# Patient Record
Sex: Male | Born: 1980
Health system: Southern US, Community
[De-identification: ages and names within clinical notes are randomized; demographics above are authoritative.]

## PROBLEM LIST (undated history)

## (undated) DIAGNOSIS — K219 Gastro-esophageal reflux disease without esophagitis: Secondary | ICD-10-CM

## (undated) DIAGNOSIS — K649 Unspecified hemorrhoids: Secondary | ICD-10-CM

## (undated) DIAGNOSIS — T7840XA Allergy, unspecified, initial encounter: Secondary | ICD-10-CM

## (undated) DIAGNOSIS — J302 Other seasonal allergic rhinitis: Secondary | ICD-10-CM

## (undated) DIAGNOSIS — Z0000 Encounter for general adult medical examination without abnormal findings: Secondary | ICD-10-CM

## (undated) DIAGNOSIS — M545 Low back pain: Secondary | ICD-10-CM

## (undated) DIAGNOSIS — I861 Scrotal varices: Secondary | ICD-10-CM

## (undated) DIAGNOSIS — R12 Heartburn: Secondary | ICD-10-CM

## (undated) DIAGNOSIS — F419 Anxiety disorder, unspecified: Secondary | ICD-10-CM

## (undated) DIAGNOSIS — R112 Nausea with vomiting, unspecified: Secondary | ICD-10-CM

## (undated) DIAGNOSIS — E785 Hyperlipidemia, unspecified: Secondary | ICD-10-CM

## (undated) DIAGNOSIS — Z8614 Personal history of Methicillin resistant Staphylococcus aureus infection: Secondary | ICD-10-CM

## (undated) DIAGNOSIS — H547 Unspecified visual loss: Secondary | ICD-10-CM

## (undated) DIAGNOSIS — M25512 Pain in left shoulder: Secondary | ICD-10-CM

## (undated) HISTORY — DX: Allergy, unspecified, initial encounter: T78.40XA

## (undated) HISTORY — DX: Heartburn: R12

## (undated) HISTORY — PX: TONSILLECTOMY: SUR1361

## (undated) HISTORY — DX: Hyperlipidemia, unspecified: E78.5

## (undated) HISTORY — DX: Scrotal varices: I86.1

## (undated) HISTORY — DX: Anxiety disorder, unspecified: F41.9

## (undated) HISTORY — DX: Pain in left shoulder: M25.512

## (undated) HISTORY — DX: Personal history of Methicillin resistant Staphylococcus aureus infection: Z86.14

## (undated) HISTORY — DX: Unspecified visual loss: H54.7

## (undated) HISTORY — DX: Other seasonal allergic rhinitis: J30.2

## (undated) HISTORY — DX: Encounter for general adult medical examination without abnormal findings: Z00.00

## (undated) HISTORY — DX: Unspecified hemorrhoids: K64.9

## (undated) HISTORY — DX: Low back pain: M54.5

## (undated) HISTORY — DX: Nausea with vomiting, unspecified: R11.2

---

## 2005-01-10 ENCOUNTER — Ambulatory Visit: Payer: Self-pay | Admitting: Internal Medicine

## 2005-01-18 ENCOUNTER — Ambulatory Visit: Payer: Self-pay | Admitting: Internal Medicine

## 2005-01-19 ENCOUNTER — Ambulatory Visit: Payer: Self-pay | Admitting: *Deleted

## 2005-01-21 ENCOUNTER — Ambulatory Visit (HOSPITAL_COMMUNITY): Admission: RE | Admit: 2005-01-21 | Discharge: 2005-01-21 | Payer: Self-pay | Admitting: Internal Medicine

## 2005-02-01 ENCOUNTER — Ambulatory Visit: Payer: Self-pay | Admitting: Internal Medicine

## 2005-04-04 ENCOUNTER — Ambulatory Visit: Payer: Self-pay | Admitting: Internal Medicine

## 2013-06-06 ENCOUNTER — Encounter (HOSPITAL_COMMUNITY): Payer: Self-pay | Admitting: *Deleted

## 2013-06-06 ENCOUNTER — Emergency Department (INDEPENDENT_AMBULATORY_CARE_PROVIDER_SITE_OTHER)
Admission: EM | Admit: 2013-06-06 | Discharge: 2013-06-06 | Disposition: A | Discharge status: Home / Self Care | Payer: Self-pay | Source: Home / Self Care

## 2013-06-06 DIAGNOSIS — F419 Anxiety disorder, unspecified: Secondary | ICD-10-CM

## 2013-06-06 DIAGNOSIS — IMO0001 Reserved for inherently not codable concepts without codable children: Secondary | ICD-10-CM

## 2013-06-06 DIAGNOSIS — M791 Myalgia, unspecified site: Secondary | ICD-10-CM

## 2013-06-06 DIAGNOSIS — R002 Palpitations: Secondary | ICD-10-CM

## 2013-06-06 DIAGNOSIS — F411 Generalized anxiety disorder: Secondary | ICD-10-CM

## 2013-06-06 HISTORY — DX: Gastro-esophageal reflux disease without esophagitis: K21.9

## 2013-06-06 MED ORDER — GI COCKTAIL ~~LOC~~: 30.0000 mL | Freq: Once | ORAL | Status: DC

## 2013-06-06 MED ORDER — HYDROXYZINE HCL 25 MG PO TABS: 25.0000 mg | ORAL_TABLET | Freq: Every evening | ORAL | Status: DC | PRN

## 2013-06-06 MED ORDER — GI COCKTAIL ~~LOC~~
ORAL | Status: AC
  Filled 2013-06-06: qty 30

## 2013-06-06 NOTE — ED Provider Notes (Signed)
Scott Molina is a 32 y.o. male who presents to Urgent Care today for multiple complaints.  1) increasing anxiety. Patient is feeling more anxious recently. He notes he has a history of bipolar disorder as a child but is currently not in any medication and has been doing well.  2) upper extremity fatigue: Patient notes soreness and fatigue in his upper extremities. He notes that he has been increasing his exercise and working out more recently. He recently started doing pushups and wonders if his arm symptoms are related. He denies any radiating pain weakness or numbness or lack of coordination. Denies any neck pain.  3) burping and increased gas: Patient notes increased burping and a decrease in appetite recently. He has a history of reflux and has been taking Pepcid occasionally. He denies any abdominal pain nausea vomiting or diarrhea.  4) palpitations: Patient notes increasing palpitations recently. He denies any chest pain or trouble breathing or wheezing. He does drink quite a bit of coffee.     PMH reviewed. Bipolar disorder as a child previously well treated with Depakote sertraline and ADHD medication History  Substance Use Topics  . Smoking status: Current Every Day Smoker  . Smokeless tobacco: Not on file  . Alcohol Use: Yes   ROS as above Medications reviewed. No current facility-administered medications for this encounter.   Current Outpatient Prescriptions  Medication Sig Dispense Refill  . Famotidine (PEPCID PO) Take by mouth.      . hydrOXYzine (ATARAX/VISTARIL) 25 MG tablet Take 1 tablet (25 mg total) by mouth at bedtime as needed for anxiety.  30 tablet  0    Exam:  BP 123/91  Pulse 105  Temp(Src) 98.3 F (36.8 C) (Oral)  Resp 28  SpO2 100% Gen: Well NAD HEENT: EOMI,  MMM Lungs: CTABL Nl WOB Heart: RRR no MRG Abd: NABS, NT, ND Exts: Non edematous BL  LE, warm and well perfused.  Neck: Nontender spinal midline normal neck range of motion negative Spurling test  bilaterally Cervical nerve root strength testing and sensation testing is intact bilateral upper extremity Psych: Anxious-appearing speech and thought process is slightly rapid and tangential. Patient perseverates about his arm complaints.  No SI/HI no delusions or hallucinations expressed. Moderate insight.    No results found for this or any previous visit (from the past 24 hour(s)). No results found.  12 lead EKG shows normal sinus rhythm at 89 beats per minute. Otherwise normal  Assessment and Plan: 32 y.o. male with essentially an anxiety disorder. I suspect the patient is having a hypomanic episode with his previously well controlled without medication bipolar disorder. He has multiple somatic complaints with no physical exam or laboratory abnormalities.  His EKG is normal appearing.  We had a lengthy discussion about the nature of his anxiety and the ideal treatment.  Plan to refer to the [facility] for further evaluation and management of his anxiety disorder.  We'll treat night  anxiety with hydroxyzine. Followup if not improving or worsening, Discussed warning signs or symptoms. Please see discharge instructions. Patient expresses understanding.       Rodolph Bong, MD 06/06/13 [phone]

## 2013-06-06 NOTE — ED Notes (Signed)
Pt  Has  Numerous  Complaints  With  The  Symptoms       Starting  About  4  Days       Ago     -   He  Reports  Symptoms  Of  Indigestion  With        Rumbling  Sensation     As   Well  As  decreased  Appetite     He  Is  Sitting  Upright on the  Exam table  He  Is  Speaking in  Complete  sentances  And      Seems  Somewhat  Anxious     He  denys  Any  Vomiting       He  Does    Report  Some vague  Symptoms  Of malaise

## 2014-03-21 ENCOUNTER — Emergency Department (INDEPENDENT_AMBULATORY_CARE_PROVIDER_SITE_OTHER)
Admission: EM | Admit: 2014-03-21 | Discharge: 2014-03-21 | Disposition: A | Discharge status: Home / Self Care | Payer: BC Managed Care – PPO | Source: Home / Self Care | Attending: Emergency Medicine | Admitting: Emergency Medicine

## 2014-03-21 ENCOUNTER — Encounter (HOSPITAL_COMMUNITY): Payer: Self-pay | Admitting: Emergency Medicine

## 2014-03-21 ENCOUNTER — Emergency Department (INDEPENDENT_AMBULATORY_CARE_PROVIDER_SITE_OTHER): Payer: BC Managed Care – PPO

## 2014-03-21 DIAGNOSIS — K219 Gastro-esophageal reflux disease without esophagitis: Secondary | ICD-10-CM

## 2014-03-21 DIAGNOSIS — F411 Generalized anxiety disorder: Secondary | ICD-10-CM

## 2014-03-21 DIAGNOSIS — F419 Anxiety disorder, unspecified: Secondary | ICD-10-CM

## 2014-03-21 MED ORDER — LORAZEPAM 0.5 MG PO TABS: 0.5000 mg | ORAL_TABLET | Freq: Three times a day (TID) | ORAL | Status: DC | PRN

## 2014-03-21 MED ORDER — OMEPRAZOLE 20 MG PO CPDR: 20.0000 mg | DELAYED_RELEASE_CAPSULE | Freq: Two times a day (BID) | ORAL | Status: DC

## 2014-03-21 NOTE — ED Notes (Signed)
Patient complains of shortness of breath with some tingling sensation of left hand; states gas and pressure in abdomen; Also states he may have been having panic attacks.  Patient triaged and brought back for EKG per protocols.

## 2014-03-21 NOTE — ED Provider Notes (Signed)
Chief Complaint   Chief Complaint  Patient presents with  . Shortness of Breath    History of Present Illness    Isla PenceMatthew S Garris is a 33 year old male who has had a three-week history of intermittent difficulty breathing. This is relieved by taking Pepcid or by resting indoors in an air conditioned environment. The patient feels tired and exhausted. Sometimes he feels like he is about to pass out. He's had a dry cough. He's also had tightness across his upper chest that comes and goes. He denies any exertional chest pain or discomfort. He's had no nausea or diaphoresis. He also complains of a toothache in his left, upper second molar, pain in his back, heartburn, palpitations, and some left hip pain. He's had no fever or chills. No URI symptoms. He denies any neck pain or pain in his upper back. He denies any syncope. There's been no abdominal pain, although his abdomen has felt bloated and tight. No nausea or vomiting. No constipation, diarrhea, or blood in the stool. He denies any extremity pain, paresthesias, or swelling. He's had no prolonged car or plane trips. No cardiac history, no history of DVT or pulmonary embolism. He was seen last summer for the same symptoms. Workup in the urgent care Center was negative. He was diagnosed as having indigestion and heartburn and anxiety. He has not gotten any of medications filled, but is just taking over-the-counter meds. He does not have a primary care doctor.  Review of Systems    Other than noted above, the patient denies any of the following symptoms. Systemic:  No fever or chills. Pulmonary:  No cough, wheezing, shortness of breath, sputum production, hemoptysis. Cardiac:  No palpitations, rapid heartbeat, dizziness, presyncope or syncope. GI:  No abdominal pain, heartburn, nausea, or vomiting. Ext:  No leg pain or swelling.  PMFSH    Past medical history, family history, social history, meds, and allergies were reviewed.   Physical Exam      Vital signs:  BP 131/75  Pulse 90  Temp(Src) 97.9 F (36.6 C) (Oral)  Resp 20  SpO2 100% Gen:  Alert, oriented, in no distress, skin warm and dry. Eye:  PERRL, lids and conjunctivas normal.  Sclera non-icteric. ENT:  Mucous membranes moist, pharynx clear. Neck:  Supple, no adenopathy or tenderness.  No JVD. Lungs:  Clear to auscultation, no wheezes, rales or rhonchi.  No respiratory distress. Heart:  Regular rhythm.  No gallops, murmers, clicks or rubs. Chest:  No chest wall tenderness. Abdomen:  Soft, nontender, no organomegaly or mass.  Bowel sounds normal.  No pulsatile abdominal mass or bruit. Ext:  No edema.  No calf tenderness and Homann's sign negative.  Pulses full and equal. Skin:  Warm and dry.  No rash.  Radiology     Dg Chest 2 View  03/21/2014   CLINICAL DATA:  Labored breathing, shortness of breath.  EXAM: CHEST  2 VIEW  COMPARISON:  None.  FINDINGS: The heart size and mediastinal contours are within normal limits. Both lungs are clear. The visualized skeletal structures are unremarkable.  IMPRESSION: No active cardiopulmonary disease.   Electronically Signed   By: Charlett NoseKevin  Dover M.D.   On: 03/21/2014 17:18   I reviewed the images independently and personally and concur with the radiologist's findings.  EKG Results:  Date: 03/21/2014  Rate: 100  Rhythm: normal sinus rhythm  QRS Axis: normal  Intervals: normal  ST/T Wave abnormalities: normal  Conduction Disutrbances:none  Narrative Interpretation: Normal sinus rhythm, normal  EKG.  Old EKG Reviewed: none available                                                                                                                                           Assessment     The primary encounter diagnosis was GERD (gastroesophageal reflux disease). A diagnosis of Anxiety was also pertinent to this visit.  No evidence of acute coronary syndrome, pulmonary embolism, pneumothorax, pericarditis, esophageal rupture, or  ruptured aneurysm.  Plan     1.  Meds:  The following meds were prescribed:   Discharge Medication List as of 03/21/2014  5:58 PM    START taking these medications   Details  LORazepam (ATIVAN) 0.5 MG tablet Take 1 tablet (0.5 mg total) by mouth every 8 (eight) hours as needed for anxiety., Starting 03/21/2014, Until Discontinued, Print    omeprazole (PRILOSEC) 20 MG capsule Take 1 capsule (20 mg total) by mouth 2 (two) times daily before a meal., Starting 03/21/2014, Until Discontinued, Normal        2.  Patient Education/Counseling:  The patient was given appropriate handouts, self care instructions, and instructed in symptomatic relief.    3.  Follow up:  The patient was told to follow up here if no better in 3 to 4 days, or sooner if becoming worse in any way, and give an an some red flag symptoms such as worsening pain, shortness of breath, dizziness, or passing out which would prompt immediate return. Suggest he followup with her primary care physician as soon as possible.     Reuben Likesavid C Bueford Arp, MD 03/21/14 [id number]

## 2014-03-21 NOTE — Discharge Instructions (Signed)

## 2014-11-25 ENCOUNTER — Ambulatory Visit (INDEPENDENT_AMBULATORY_CARE_PROVIDER_SITE_OTHER): Payer: 59 | Admitting: Medical

## 2014-11-25 ENCOUNTER — Encounter: Payer: Self-pay | Admitting: Medical

## 2014-11-25 VITALS — BP 112/80 | HR 78 | Temp 98.0°F | Resp 16 | Wt 180.0 lb

## 2014-11-25 DIAGNOSIS — K648 Other hemorrhoids: Secondary | ICD-10-CM

## 2014-11-25 DIAGNOSIS — K644 Residual hemorrhoidal skin tags: Secondary | ICD-10-CM

## 2014-11-25 DIAGNOSIS — K036 Deposits [accretions] on teeth: Secondary | ICD-10-CM

## 2014-11-25 DIAGNOSIS — S025XXA Fracture of tooth (traumatic), initial encounter for closed fracture: Secondary | ICD-10-CM

## 2014-11-25 DIAGNOSIS — R109 Unspecified abdominal pain: Secondary | ICD-10-CM

## 2014-11-25 DIAGNOSIS — L309 Dermatitis, unspecified: Secondary | ICD-10-CM

## 2014-11-25 DIAGNOSIS — K219 Gastro-esophageal reflux disease without esophagitis: Secondary | ICD-10-CM

## 2014-11-25 MED ORDER — HYDROCORTISONE 2.5 % RE CREA: 1.0000 "application " | TOPICAL_CREAM | Freq: Two times a day (BID) | RECTAL | Status: DC

## 2014-11-25 NOTE — Progress Notes (Signed)
Subjective: Here as a new patient today.  Has several concerns.  Usually in good health, has several questions, no recent health care visits.  Here for concern of hemorrhoids.  As a kid had hemorrhoid from time to time that would resolve within a day or 2.  He notes current hemorrhoid x 1 week currently, itching, not sure about hanging lesion except when using the bathroom.    Has seen small bit of blood on toilet paper here in the past week.  Was bright red.  Takes a bath daily.  Uses diaper wipes with allow.  There is some discomfort with wiping.  Has BM daily in general, but with oatmeal, goes 2-3 times daily.  Usually stools are solid, not too form.  This is the first time he has had blood.   GERD - takes Pepcid occasionally, including last week given that he has been eating some pizza.     Doesn't take NSAID regularly, but does so occasionally for intermittent back and leg pains.   Is a cigar smoker.  Rarely drinks alcohol, at most 6-12 glasses of wine yearly, occasional rare scotch, few beers in a year's time.    Has intermittent rash that flares up on both upper lateral arms.     ROS as in subjective   Objective: BP 112/80 mmHg  Pulse 78  Temp(Src) 98 F (36.7 C) (Oral)  Resp 16  Wt 180 lb (81.647 kg)  General appearance: alert, no distress, WD/WN Oral cavity: MMM, moderate plaque throughout, 1 left upper posterior molar with cracked tooth, no other lesions Neck: supple, no lymphadenopathy, no thyromegaly, no masses Heart: RRR, normal S1, S2, no murmurs Lungs: CTA bilaterally, no wheezes, rhonchi, or rales Abdomen: +bs, soft, non tender, non distended, no masses, no hepatomegaly, no splenomegaly Pulses: 2+ symmetric, upper and lower extremities, normal cap refill DRE: anterior slightly tender hemorrhoid, small, non thrombosed, no fissure, otherwise normal appearing, declines DRE Skin: upper lateral arms bilat with small rough skin patches, nonspecific, suggestive of  eczema  Assessment: Encounter Diagnoses  Name Primary?  . External hemorrhoid Yes  . Gastroesophageal reflux disease without esophagitis   . Abdominal discomfort   . Eczema   . Dental plaque   . Broken tooth, closed, initial encounter      Plan: External hemorrhoid - discussed diagnosis, treatment, prevention, fiber and water intake, avoid heavy lifting, prolonged straining.   Can use triamcinolone topical prn for up to a 5-7 days at a time, SITZ baths, can use wet wipes during flare ups.  Discussed possible complications.   GERD - avoid reflux triggers, c/t Pepcid prn  Abdominal discomfort - likely due to diet choices.  Avoid triggers.  Eczema - can use triamcinolone cream topical as long as cream not contaminated  Dental plaque, broken tooth - f/u with dentist soon

## 2015-04-23 ENCOUNTER — Ambulatory Visit (HOSPITAL_BASED_OUTPATIENT_CLINIC_OR_DEPARTMENT_OTHER)
Admission: RE | Admit: 2015-04-23 | Discharge: 2015-04-23 | Disposition: A | Discharge status: Home / Self Care | Payer: 59 | Source: Ambulatory Visit | Attending: Internal Medicine | Admitting: Internal Medicine

## 2015-04-23 ENCOUNTER — Encounter: Payer: Self-pay | Admitting: Internal Medicine

## 2015-04-23 ENCOUNTER — Other Ambulatory Visit: Payer: Self-pay | Admitting: Internal Medicine

## 2015-04-23 ENCOUNTER — Telehealth: Payer: Self-pay

## 2015-04-23 ENCOUNTER — Ambulatory Visit (INDEPENDENT_AMBULATORY_CARE_PROVIDER_SITE_OTHER): Payer: Self-pay | Admitting: Internal Medicine

## 2015-04-23 VITALS — BP 148/92 | HR 87 | Temp 98.0°F | Ht 71.25 in | Wt 185.4 lb

## 2015-04-23 DIAGNOSIS — N503 Cyst of epididymis: Secondary | ICD-10-CM | POA: Insufficient documentation

## 2015-04-23 DIAGNOSIS — I861 Scrotal varices: Secondary | ICD-10-CM | POA: Diagnosis not present

## 2015-04-23 DIAGNOSIS — R1909 Other intra-abdominal and pelvic swelling, mass and lump: Secondary | ICD-10-CM | POA: Diagnosis present

## 2015-04-23 DIAGNOSIS — F4323 Adjustment disorder with mixed anxiety and depressed mood: Secondary | ICD-10-CM

## 2015-04-23 DIAGNOSIS — N5082 Scrotal pain: Secondary | ICD-10-CM

## 2015-04-23 DIAGNOSIS — N508 Other specified disorders of male genital organs: Secondary | ICD-10-CM

## 2015-04-23 MED ORDER — LORAZEPAM 0.5 MG PO TABS: 0.5000 mg | ORAL_TABLET | Freq: Three times a day (TID) | ORAL | Status: DC | PRN

## 2015-04-23 NOTE — Patient Instructions (Addendum)
We will schedule a ultrasound  Please keep the appointment to see your no primary doctor Dr. Abner GreenspanBlyth  Call anytime if the area start hurting or gets worse     Varicocele A varicocele is a swelling of veins in the scrotum (the bag of skin that contains the testicles). It is most common in young men. It occurs most often on the left side. Small or painless varicoceles do not need treatment. Most often, this is not a serious problem, but further tests may be needed to confirm the diagnosis. Surgery may be needed if complications of varicoceles arise. Rarely, varicoceles can reoccur after surgery. CAUSES  The swelling is due to blood backing up in the vein that leads from the testicle back to the body. Blood backs up because the valves inside the vein are not working properly. Veins normally return blood to the heart. Valves in veins are supposed to be one-way valves. They should not allow blood to flow backwards. If the valves do not work well, blood can pool in a vein and make it swell. The same thing happens with varicose veins in the leg. SYMPTOMS  A varicocele most often causes no symptoms. When they occur, symptoms include:   Swelling on one side of the scrotum.  Swelling that is more obvious when standing up.  A lumpy feeling in the scrotum.  Heaviness on one side of the scrotum.  Dull ache in the scrotum, especially after exercise or prolonged standing or sitting.  Slower growth or reduced size of the testicle on the side of the varicocele (in young males).  Problems with fertility can arise if the testicle does not grow normally. DIAGNOSIS  Varicocele is usually diagnosed by a physical exam. Sometimes ultrasonography is done. TREATMENT  Usually, varicoceles need no treatment. They are often routinely monitored on exam by your caregiver to ensure they do not slow the growth of the testicle on that side. Treatment may be needed if:  The varicocele is large.  There is a lot of  pain.  The varicocele causes a decrease in the size of the testicle in a growing adolescent.  The other testicle is absent or not normal.  Varicoceles are found on both sides of the scrotum.  There is pain when exercising.  There are fertility problems. There are two types of treatment:  Surgery. The surgeon ties off the swollen veins. Surgery may be done with an incision in the skin or through a laparoscope. The surgery is usually done in an outpatient setting. Outpatient means there is no overnight stay in a hospital.  Embolization. A small tube is placed in a vein and guided into the swollen veins. X-rays are used to guide the small tube. Tiny metal coils or other blocking items are put through the tube. This blocks swollen veins and the flow of blood. This is usually done in an outpatient setting without the use of general anesthesia. HOME CARE INSTRUCTIONS  To decrease discomfort:  Wear supportive underwear.  Use an athletic supporter for sports.  Only take over-the-counter or prescription medicines for pain or discomfort as directed by your caregiver. SEEK MEDICAL CARE IF:   Pain is increasing.  Swelling does not decrease when lying down.  Testicle is smaller.  The testicle becomes enlarged, swollen, red, or painful. Document Released: 01/02/2001 Document Revised: 12/19/2011 Document Reviewed: 01/06/2010 [facility] Patient Information 2015 [employer] [place]. This information is not intended to replace advice given to you by your health care provider. Make sure you  discuss any questions you have with your health care provider.  

## 2015-04-23 NOTE — Telephone Encounter (Signed)
-----   Message from Oneal GroutJennifer S Sebastian sent at 04/23/2015  1:10 PM EDT ----- Pt is coming today for US, order is not in. Can you place order?

## 2015-04-23 NOTE — Progress Notes (Signed)
Pre visit review using our clinic review tool, if applicable. No additional management support is needed unless otherwise documented below in the visit note. 

## 2015-04-23 NOTE — Progress Notes (Signed)
Subjective:    Patient ID: Scott Molina, male    DOB: September 21, 1981, 34 y.o.   MRN: [id number]  DOS:  04/23/2015 Type of visit - description : To get established with Dr. Abner Greenspan as PCP in few months but here for an acute visit Interval history: His concern today about a lump on the left scrotum, has been there since 2006, at that time a ultrasound was done, was told it was benign. The area feels puffy, denies pain, congestion feeling? Symptoms are on a no but more noticeable lately. Also he takes Ativan as needed and request a refill   Review of Systems  Denies fever chills No scrotal injury No dysuria, gross hematuria difficulty urinating.  Past Medical History  Diagnosis Date  . GERD (gastroesophageal reflux disease)   . Allergy   . Anxiety     At [employer]  . History of MRSA infection     Past Surgical History  Procedure Laterality Date  . Tonsillectomy      History   Social History  . Marital Status: Single    Spouse Name: N/A  . Number of Children: 0  . Years of Education: N/A   Occupational History  . 2 jobs    Social History Main Topics  . Smoking status: Current Some Day Smoker  . Smokeless tobacco: Not on file     Comment: CIGARS  . Alcohol Use: 0.0 oz/week    0 Standard drinks or equivalent per week  . Drug Use: Not on file  . Sexual Activity: Not on file   Other Topics Concern  . Not on file   Social History Narrative   Live w/ family     Family History  Problem Relation Age of Onset  . Colon cancer Neg Hx   . Prostate cancer Neg Hx   . Testicular cancer        Medication List       This list is accurate as of: 04/23/15  2:17 PM.  Always use your most recent med list.               LORazepam 0.5 MG tablet  Commonly known as:  ATIVAN  Take 1 tablet (0.5 mg total) by mouth every 8 (eight) hours as needed for anxiety.     PEPCID PO  Take 1 tablet by mouth daily.           Objective:   Physical Exam BP 148/92 mmHg   Pulse 87  Temp(Src) 98 F (36.7 C) (Oral)  Ht 5' 11.25" (1.81 m)  Wt 185 lb 6 oz (84.086 kg)  BMI 25.67 kg/m2  SpO2 97% General:   Well developed, well nourished . NAD.  HEENT:  Normocephalic . Face symmetric, atraumatic Lungs:  CTA B Normal respiratory effort, no intercostal retractions, no accessory muscle use. Heart: RRR,  no murmur.  no pretibial edema bilaterally  Abdomen:  Not distended, soft, non-tender. No rebound or rigidity. No mass,organomegaly GU: Scrotal contents: Testicles seem normal, there is a soft, nodular mass around the left testicle. Slightly sensitive but not really tender. No hernias Penis normal Skin: Not pale. Not jaundice Neurologic:  alert & oriented X3.  Speech normal, gait appropriate for age and unassisted Psych--  Cognition and judgment appear intact.  Cooperative with normal attention span and concentration.  Behavior appropriate. No anxious or depressed appearing.       Assessment & Plan:    Scrotal mass, discomfort, suspect varicocele Explained the patient  the benign nature of this condition however in order to be certain needs a ultrasound. Also explained that it can affect his fertility at some point and if the symptoms increase surgery is an option. He seemed to understand and will call me if he likes a urology referral. Multiple questions answered to the best of my ability, information provided  Anxiety, Request a refill, provide a small amount of Ativan until he sees his new PCP

## 2015-10-29 ENCOUNTER — Ambulatory Visit (INDEPENDENT_AMBULATORY_CARE_PROVIDER_SITE_OTHER): Payer: BLUE CROSS/BLUE SHIELD | Admitting: Family Medicine

## 2015-10-29 ENCOUNTER — Encounter: Payer: Self-pay | Admitting: Family Medicine

## 2015-10-29 VITALS — BP 102/76 | HR 93 | Temp 98.2°F | Ht 71.0 in | Wt 193.0 lb

## 2015-10-29 DIAGNOSIS — M545 Low back pain: Secondary | ICD-10-CM

## 2015-10-29 DIAGNOSIS — H547 Unspecified visual loss: Secondary | ICD-10-CM

## 2015-10-29 DIAGNOSIS — K649 Unspecified hemorrhoids: Secondary | ICD-10-CM | POA: Insufficient documentation

## 2015-10-29 DIAGNOSIS — R1013 Epigastric pain: Secondary | ICD-10-CM

## 2015-10-29 DIAGNOSIS — R112 Nausea with vomiting, unspecified: Secondary | ICD-10-CM

## 2015-10-29 DIAGNOSIS — R12 Heartburn: Secondary | ICD-10-CM | POA: Diagnosis not present

## 2015-10-29 DIAGNOSIS — I861 Scrotal varices: Secondary | ICD-10-CM

## 2015-10-29 DIAGNOSIS — K219 Gastro-esophageal reflux disease without esophagitis: Secondary | ICD-10-CM | POA: Insufficient documentation

## 2015-10-29 DIAGNOSIS — Z0000 Encounter for general adult medical examination without abnormal findings: Secondary | ICD-10-CM | POA: Diagnosis not present

## 2015-10-29 DIAGNOSIS — J302 Other seasonal allergic rhinitis: Secondary | ICD-10-CM

## 2015-10-29 HISTORY — DX: Unspecified hemorrhoids: K64.9

## 2015-10-29 HISTORY — DX: Unspecified visual loss: H54.7

## 2015-10-29 HISTORY — DX: Encounter for general adult medical examination without abnormal findings: Z00.00

## 2015-10-29 HISTORY — DX: Heartburn: R12

## 2015-10-29 HISTORY — DX: Other seasonal allergic rhinitis: J30.2

## 2015-10-29 HISTORY — DX: Nausea with vomiting, unspecified: R11.2

## 2015-10-29 NOTE — Patient Instructions (Addendum)
Salon pas or Aspercreme patches. Daily or twice daily Moist heat and stretching [employer] does a good hearing exam and an eye exam. Probiotic daily such as Digestive Advantage or Aneta Mins Colon or online at [employer] has NOW company 10 strain cap take one daily Back Pain, Adult Back pain is very common in adults.The cause of back pain is rarely dangerous and the pain often gets better over time.The cause of your back pain may not be known. Some common causes of back pain include:  Strain of the muscles or ligaments supporting the spine.  Wear and tear (degeneration) of the spinal disks.  Arthritis.  Direct injury to the back. For many people, back pain may return. Since back pain is rarely dangerous, most people can learn to manage this condition on their own. HOME CARE INSTRUCTIONS Watch your back pain for any changes. The following actions may help to lessen any discomfort you are feeling:  Remain active. It is stressful on your back to sit or stand in one place for long periods of time. Do not sit, drive, or stand in one place for more than 30 minutes at a time. Take short walks on even surfaces as soon as you are able.Try to increase the length of time you walk each day.  Exercise regularly as directed by your health care provider. Exercise helps your back heal faster. It also helps avoid future injury by keeping your muscles strong and flexible.  Do not stay in bed.Resting more than 1-2 days can delay your recovery.  Pay attention to your body when you bend and lift. The most comfortable positions are those that put less stress on your recovering back. Always use proper lifting techniques, including:  Bending your knees.  Keeping the load close to your body.  Avoiding twisting.  Find a comfortable position to sleep. Use a firm mattress and lie on your side with your knees slightly bent. If you lie on your back, put a pillow under your knees.  Avoid feeling anxious or  stressed.Stress increases muscle tension and can worsen back pain.It is important to recognize when you are anxious or stressed and learn ways to manage it, such as with exercise.  Take medicines only as directed by your health care provider. Over-the-counter medicines to reduce pain and inflammation are often the most helpful.Your health care provider may prescribe muscle relaxant drugs.These medicines help dull your pain so you can more quickly return to your normal activities and healthy exercise.  Apply ice to the injured area:  Put ice in a plastic bag.  Place a towel between your skin and the bag.  Leave the ice on for 20 minutes, 2-3 times a day for the first 2-3 days. After that, ice and heat may be alternated to reduce pain and spasms.  Maintain a healthy weight. Excess weight puts extra stress on your back and makes it difficult to maintain good posture. SEEK MEDICAL CARE IF:  You have pain that is not relieved with rest or medicine.  You have increasing pain going down into the legs or buttocks.  You have pain that does not improve in one week.  You have night pain.  You lose weight.  You have a fever or chills. SEEK IMMEDIATE MEDICAL CARE IF:   You develop new bowel or bladder control problems.  You have unusual weakness or numbness in your arms or legs.  You develop nausea or vomiting.  You develop abdominal pain.  You feel faint.   This  information is not intended to replace advice given to you by your health care provider. Make sure you discuss any questions you have with your health care provider.   Document Released: 09/26/2005 Document Revised: 10/17/2014 Document Reviewed: 01/28/2014 Elsevier Interactive Patient Education [employer].

## 2015-10-29 NOTE — Progress Notes (Signed)
Subjective:    Patient ID: Scott Molina, male    DOB: [birth year], 35 y.o.   MRN: [id number]  Chief Complaint  Patient presents with  . Annual Exam    new patient    HPI Patient is in today for annual exam, follow-up on numerous medical concerns and official new patient appointment. He does denies any acute illness or fever but does have some ongoing trouble with low back pain which is worsened recently. He has some radiculopathy into both extremities left more than right at times. Notes some crepitus in his knees. Denies any recent falls or trauma. Has some occasional trouble with hemorrhoids scant blood on the tissue after straining. No excessive bleeding or severe pain. Denies CP/palp/SOB/HA/congestion/fevers. Taking meds as prescribed  Past Medical History  Diagnosis Date  . GERD (gastroesophageal reflux disease)   . Allergy   . Anxiety     At [employer]  . History of MRSA infection   . Preventative health care 10/29/2015  . Decreased visual acuity 10/29/2015  . Nausea with vomiting 10/29/2015  . Heartburn 10/29/2015  . Hemorrhoid 10/29/2015  . Seasonal allergies 10/29/2015  . Low back pain 11/08/2015  . Left varicocele 11/08/2015    Past Surgical History  Procedure Laterality Date  . Tonsillectomy      Family History  Problem Relation Age of Onset  . Colon cancer Neg Hx   . Prostate cancer Neg Hx   . Testicular cancer    . Diabetes Mother   . Diabetes Father   . Hepatitis C Father   . Arthritis Father     back pain  . Arthritis Sister     knees and back  . Arthritis Brother   . Arthritis Maternal Uncle     wear and tear  . Cancer Paternal Grandmother     ? form    Social History   Social History  . Marital Status: Single    Spouse Name: N/A  . Number of Children: 0  . Years of Education: N/A   Occupational History  . 2 jobs    Social History Main Topics  . Smoking status: Smoker, Current Status Unknown    Types: Cigars, Pipe  . Smokeless  tobacco: Not on file     Comment: CIGARS  . Alcohol Use: 0.0 oz/week    0 Standard drinks or equivalent per week  . Drug Use: Not on file  . 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 Not on file   Social History Narrative   Live w/ family    Outpatient Prescriptions Prior to Visit  Medication Sig Dispense Refill  . Famotidine (PEPCID PO) Take 1 tablet by mouth daily.     Marland Kitchen LORazepam (ATIVAN) 0.5 MG tablet Take 1 tablet (0.5 mg total) by mouth every 8 (eight) hours as needed for anxiety. (Patient not taking: Reported on 10/29/2015) 30 tablet 0   No facility-administered medications prior to visit.    Allergies  Allergen Reactions  . Aspartame And Phenylalanine   . Aspergum [Aspirin]     This is in gum    Review of Systems  Constitutional: Negative for fever, chills and malaise/fatigue.  HENT: Negative for congestion and hearing loss.   Eyes: Negative for discharge.  Respiratory: Negative for cough, sputum production and shortness of breath.   Cardiovascular: Negative for chest pain,  palpitations and leg swelling.  Gastrointestinal: Positive for heartburn, abdominal pain and blood in stool. Negative for nausea, vomiting, diarrhea and constipation.  Genitourinary: Negative for dysuria, urgency, frequency and hematuria.  Musculoskeletal: Positive for back pain. Negative for myalgias and falls.  Skin: Negative for rash.  Neurological: Negative for dizziness, sensory change, loss of consciousness, weakness and headaches.  Endo/Heme/Allergies: Negative for environmental allergies. Does not bruise/bleed easily.  Psychiatric/Behavioral: Negative for depression and suicidal ideas. The patient is not nervous/anxious and does not have insomnia.        Objective:    Physical Exam  Constitutional: He is oriented to person, place, and time. He appears well-developed  and well-nourished. No distress.  HENT:  Head: Normocephalic and atraumatic.  Eyes: Conjunctivae are normal.  Neck: Neck supple. No thyromegaly present.  Cardiovascular: Normal rate, regular rhythm and normal heart sounds.   No murmur heard. Pulmonary/Chest: Effort normal and breath sounds normal. No respiratory distress. He has no wheezes.  Abdominal: Soft. Bowel sounds are normal. He exhibits no mass. There is no tenderness.  Musculoskeletal: He exhibits no edema.  Lymphadenopathy:    He has no cervical adenopathy.  Neurological: He is alert and oriented to person, place, and time.  Skin: Skin is warm and dry.  Psychiatric: He has a normal mood and affect. His behavior is normal.    BP 102/76 mmHg  Pulse 93  Temp(Src) 98.2 F (36.8 C) (Oral)  Ht  (1.803 m)  Wt 193 lb (87.544 kg)  BMI 26.93 kg/m2  SpO2 98% Wt Readings from Last 3 Encounters:  10/29/15 193 lb (87.544 kg)  04/23/15 185 lb 6 oz (84.086 kg)  11/25/14 180 lb (81.647 kg)     No results found for: WBC, HGB, HCT, PLT, GLUCOSE, CHOL, TRIG, HDL, LDLDIRECT, LDLCALC, ALT, AST, NA, K, CL, CREATININE, BUN, CO2, TSH, PSA, INR, GLUF, HGBA1C, MICROALBUR      Assessment & Plan:   Problem List Items Addressed This Visit    Decreased visual acuity   Epigastric pain    H pylori negative, start probiotics report if worsens      Relevant Orders   US Abdomen Complete   Heartburn    Avoid offending foods, start probiotics. Do not eat large meals in late evening and consider raising head of bed.       Relevant Orders   US Abdomen Complete   Hemorrhoid    Increase fluid, fiber and add probiotics. Consider referral to GI if symptoms do not improve      Left varicocele   Low back pain    Encouraged moist heat and gentle stretching as tolerated. May try NSAIDs and prescription meds as directed and report if symptoms worsen or seek immediate care      Nausea with vomiting    Resolved today. Encouraged to avoid  offending foots and try ginger prn      Preventative health care - Primary    .Patient encouraged to maintain heart healthy diet, regular exercise, adequate sleep. Consider daily probiotics. Take medications as prescribed      Seasonal allergies    Consider nasal saline and Flonase and breath [facility]         I am having Mr. Romagnoli maintain his Famotidine (PEPCID PO) and LORazepam.  No orders of the defined types were placed in this encounter.     Reuel Derby, MD

## 2015-10-29 NOTE — Assessment & Plan Note (Signed)
Consider nasal saline and Flonase and breath [facility]

## 2015-10-29 NOTE — Progress Notes (Signed)
Pre visit review using our clinic review tool, if applicable. No additional management support is needed unless otherwise documented below in the visit note. 

## 2015-11-08 ENCOUNTER — Encounter: Payer: Self-pay | Admitting: Family Medicine

## 2015-11-08 DIAGNOSIS — M545 Low back pain, unspecified: Secondary | ICD-10-CM

## 2015-11-08 DIAGNOSIS — R1013 Epigastric pain: Secondary | ICD-10-CM | POA: Insufficient documentation

## 2015-11-08 DIAGNOSIS — I861 Scrotal varices: Secondary | ICD-10-CM | POA: Insufficient documentation

## 2015-11-08 HISTORY — DX: Scrotal varices: I86.1

## 2015-11-08 HISTORY — DX: Low back pain, unspecified: M54.50

## 2015-11-08 NOTE — Assessment & Plan Note (Signed)
Avoid offending foods, start probiotics. Do not eat large meals in late evening and consider raising head of bed.  

## 2015-11-08 NOTE — Assessment & Plan Note (Signed)
Resolved today. Encouraged to avoid offending foots and try ginger prn

## 2015-11-08 NOTE — Assessment & Plan Note (Signed)
H pylori negative, start probiotics report if worsens

## 2015-11-08 NOTE — Assessment & Plan Note (Signed)
Increase fluid, fiber and add probiotics. Consider referral to GI if symptoms do not improve

## 2015-11-08 NOTE — Assessment & Plan Note (Signed)
Encouraged moist heat and gentle stretching as tolerated. May try NSAIDs and prescription meds as directed and report if symptoms worsen or seek immediate care 

## 2015-11-08 NOTE — Assessment & Plan Note (Signed)
Patient encouraged to maintain heart healthy diet, regular exercise, adequate sleep. Consider daily probiotics. Take medications as prescribed 

## 2016-01-11 ENCOUNTER — Ambulatory Visit (INDEPENDENT_AMBULATORY_CARE_PROVIDER_SITE_OTHER): Payer: BLUE CROSS/BLUE SHIELD | Admitting: Family

## 2016-01-11 ENCOUNTER — Telehealth: Payer: Self-pay | Admitting: Family Medicine

## 2016-01-11 ENCOUNTER — Encounter: Payer: Self-pay | Admitting: Family

## 2016-01-11 VITALS — BP 122/86 | HR 83 | Temp 98.3°F | Resp 16 | Ht 71.0 in | Wt 190.2 lb

## 2016-01-11 DIAGNOSIS — R002 Palpitations: Secondary | ICD-10-CM

## 2016-01-11 DIAGNOSIS — F411 Generalized anxiety disorder: Secondary | ICD-10-CM

## 2016-01-11 DIAGNOSIS — K219 Gastro-esophageal reflux disease without esophagitis: Secondary | ICD-10-CM

## 2016-01-11 LAB — URINALYSIS, ROUTINE W REFLEX MICROSCOPIC
BILIRUBIN URINE: NEGATIVE
HGB URINE DIPSTICK: NEGATIVE
Ketones, ur: NEGATIVE
Leukocytes, UA: NEGATIVE
NITRITE: NEGATIVE
PH: 8.5 — AB (ref 5.0–8.0)
Specific Gravity, Urine: 1.02 (ref 1.000–1.030)
Total Protein, Urine: NEGATIVE
Urine Glucose: NEGATIVE
Urobilinogen, UA: 0.2 (ref 0.0–1.0)

## 2016-01-11 LAB — BASIC METABOLIC PANEL
BUN: 12 mg/dL (ref 6–23)
CHLORIDE: 105 meq/L (ref 96–112)
CO2: 28 meq/L (ref 19–32)
CREATININE: 0.86 mg/dL (ref 0.40–1.50)
Calcium: 9.6 mg/dL (ref 8.4–10.5)
GFR: 109.11 mL/min (ref >60.00)
GLUCOSE: 102 mg/dL — AB (ref 70–99)
Potassium: 3.7 mEq/L (ref 3.5–5.1)
SODIUM: 141 meq/L (ref 135–145)

## 2016-01-11 LAB — CBC WITH DIFFERENTIAL/PLATELET
HCT: 50.7 % (ref 39.0–52.0)
Hemoglobin: 17.4 g/dL — ABNORMAL HIGH (ref 13.0–17.0)
MCHC: 34.4 g/dL (ref 30.0–36.0)
MCV: 87 fl (ref 78.0–100.0)
Platelets: 304 10*3/uL (ref 150.0–400.0)
RBC: 5.82 Mil/uL — AB (ref 4.22–5.81)
RDW: 13.6 % (ref 11.5–15.5)
WBC: 8.3 10*3/uL (ref 4.0–10.5)

## 2016-01-11 LAB — H. PYLORI ANTIBODY, IGG: H Pylori IgG: NEGATIVE

## 2016-01-11 LAB — TSH: TSH: 1.86 u[IU]/mL (ref 0.35–4.50)

## 2016-01-11 MED ORDER — OMEPRAZOLE 40 MG PO CPDR: 40.0000 mg | DELAYED_RELEASE_CAPSULE | Freq: Every day | ORAL | Status: DC

## 2016-01-11 NOTE — Progress Notes (Signed)
Subjective:    Patient ID: Scott Molina, male    DOB: [birth year], 35 y.o.   MRN: [id number]  HPI   Scott Molina is a 35 yr old male with history of anxiety who presents today with chief complaint of intermittent palpitations.  He reports that he developed palpitations on Friday after eating.  Today, palpitations are more constant.  Reports that he he had similar symptoms in the past when he was diagnosed with anxiety.  Caffeine seems to make the symptoms worsen. Had symptoms Saturday AM and Saturday evening.  Experienced again this AM.  Notes that he took lorazepam.  Drinks large Ice tea in the AM and a coke in the afternoon.   Notes normal BM's.  + bloating- seems worse after dairy.  Yawning more.  Urine has been cloudy. Denies dysuria.    He reports that boss will be going out of town, worried about finances.  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      Review of Systems    see HPI  Past Medical History  Diagnosis Date  . GERD (gastroesophageal reflux disease)   . Allergy   . Anxiety     At [employer]  . History of MRSA infection   . Preventative health care 10/29/2015  . Decreased visual acuity 10/29/2015  . Nausea with vomiting 10/29/2015  . Heartburn 10/29/2015  . Hemorrhoid 10/29/2015  . Seasonal allergies 10/29/2015  . Low back pain 11/08/2015  . Left varicocele 11/08/2015    Social History   Social History  . Marital Status: Single    Spouse Name: N/A  . Number of Children: 0  . Years of Education: N/A   Occupational History  . 2 jobs    Social History Main Topics  . Smoking status: Former Smoker    Types: Cigars, Pipe  . Smokeless tobacco: Not on file     Comment: CIGARS  . Alcohol Use: 0.0 oz/week    0 Standard drinks or equivalent per week  . Drug Use: Not on file  . 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 Not on file   Social History  Narrative   Live w/ family    Past Surgical History  Procedure Laterality Date  . Tonsillectomy      Family History  Problem Relation Age of Onset  . Colon cancer Neg Hx   . Prostate cancer Neg Hx   . Testicular cancer    . Diabetes Mother   . Diabetes Father   . Hepatitis C Father   . Arthritis Father     back pain  . Arthritis Sister     knees and back  . Arthritis Brother   . Arthritis Maternal Uncle     wear and tear  . Cancer Paternal Grandmother     ? form    Allergies  Allergen Reactions  . Aspartame And Phenylalanine   . Aspergum [Aspirin]     This is in gum    Current Outpatient Prescriptions on File Prior to Visit  Medication Sig Dispense Refill  . Famotidine (PEPCID PO) Take 1 tablet by mouth daily.     Marland Kitchen LORazepam (ATIVAN) 0.5 MG tablet Take 1 tablet (0.5 mg total) by mouth every 8 (eight) hours as needed for anxiety. 30 tablet 0   No current facility-administered medications on file prior to visit.  BP 122/86 mmHg  Pulse 83  Temp(Src) 98.3 F (36.8 C) (Oral)  Resp 16  Ht 5\' 11"  (1.803 m)  Wt 190 lb 3.2 oz (86.274 kg)  BMI 26.54 kg/m2  SpO2 98%    Objective:   Physical Exam  Constitutional: He is oriented to person, place, and time. He appears well-developed and well-nourished. No distress.  HENT:  Head: Normocephalic and atraumatic.  Cardiovascular: Normal rate and regular rhythm.   No murmur heard. Pulmonary/Chest: Effort normal and breath sounds normal. No respiratory distress. He has no wheezes. He has no rales.  Musculoskeletal: He exhibits no edema.  Neurological: He is alert and oriented to person, place, and time.  Skin: Skin is warm and dry.  Psychiatric: His behavior is normal. Thought content normal.  Very anxious.          Assessment & Plan:  Palpitations- reviewed EKG- NSR with some PAC's.  Will obtain bmet, cbc, tsh. We did discuss possibility of adding a low dose beta blocker to help with his symptoms, however he  declines at this time. Discussed limiting caffeine.   Anxiety- I think this is a significant problem for him.  We discussed adding an SSRI, he wishes to think about this and continue PRN lorazepam in the meantime.  Abdominal bloating- Seems worse after dairy. Discussed trial off of dairy to see if this helps. He never did complete the US which Dr. Abner GreenspanBlyth ordered due to cost.    GERD- He feels that his gerd symptoms are uncontrolled. D/c pepcid, start omeprazole. He requests H pylori testing.

## 2016-01-11 NOTE — Telephone Encounter (Signed)
Pt came in for the appt as scheduled below.

## 2016-01-11 NOTE — Telephone Encounter (Signed)
Patient Name: Scott Molina DOB: [birth year] Initial Comment Caller states he's having heart palpations. Nurse Assessment Nurse: Yetta BarreJones, RN, Miranda Date/Time (Eastern Time): 01/11/2016 8:57:44 AM Confirm and document reason for call. If symptomatic, describe symptoms. You must click the next button to save text entered. ---Caller states since Thursday he had had heart palpitations, but today he has a fast heart rate. Has the patient traveled out of the country within the last 30 days? ---No Does the patient have any new or worsening symptoms? ---Yes Will a triage be completed? ---Yes Related visit to physician within the last 2 weeks? ---No Does the PT have any chronic conditions? (i.e. diabetes, asthma, etc.) ---Yes List chronic conditions. ---Anxiety, GERD Is this a behavioral health or substance abuse call? ---No Guidelines Guideline Title Affirmed Question Affirmed Notes Heart Rate and Heartbeat Questions [1] Skipped or extra beat(s) AND [2] occurs 4 or more times per minute Final Disposition User See Physician within 4 Hours (or PCP triage) Yetta BarreJones, RN, Miranda Comments He took a dose of Ativan 5-10 min ago. Pt already has an appt scheduled for today at 10:30am with Sandford CrazeMelissa O'Sullivan Referrals REFERRED TO PCP OFFICE Disagree/Comply: Comply

## 2016-01-11 NOTE — Progress Notes (Signed)
Pre visit review using our clinic review tool, if applicable. No additional management support is needed unless otherwise documented below in the visit note. 

## 2016-01-11 NOTE — Telephone Encounter (Signed)
Pt called in to schedule an appt. He says that his heart has started feeling weird. Pt says that it started on Thursday. Scheduled pt to come in today with NP Sandford CrazeMelissa O'Sullivan. ALSO, transferred pt to [facility] due to current symptoms.

## 2016-01-11 NOTE — Patient Instructions (Signed)
Stop pepcid, start omeprazole 40mg . Try eliminating dairy from your diet to see if this helps with your bloating. Try to limit your caffeine consumption. You may use lorazepam as needed for anxiety.  Let us know if you would like to add a once daily medication for your anxiety.

## 2016-01-12 ENCOUNTER — Telehealth: Payer: Self-pay | Admitting: Family

## 2016-01-12 NOTE — Telephone Encounter (Signed)
Lab work looks good. H pylori is negative. Kidney function, electrolytes and thyroid normal.

## 2016-01-13 NOTE — Telephone Encounter (Signed)
Notified pt. 

## 2016-02-12 ENCOUNTER — Encounter: Payer: Self-pay | Admitting: Family Medicine

## 2016-02-12 ENCOUNTER — Ambulatory Visit (INDEPENDENT_AMBULATORY_CARE_PROVIDER_SITE_OTHER): Payer: BLUE CROSS/BLUE SHIELD | Admitting: Family Medicine

## 2016-02-12 VITALS — BP 120/80 | HR 97 | Temp 98.2°F | Ht 71.0 in | Wt 186.1 lb

## 2016-02-12 DIAGNOSIS — R12 Heartburn: Secondary | ICD-10-CM | POA: Diagnosis not present

## 2016-02-12 DIAGNOSIS — M5440 Lumbago with sciatica, unspecified side: Secondary | ICD-10-CM

## 2016-02-12 DIAGNOSIS — R002 Palpitations: Secondary | ICD-10-CM | POA: Diagnosis not present

## 2016-02-12 DIAGNOSIS — M25512 Pain in left shoulder: Secondary | ICD-10-CM | POA: Diagnosis not present

## 2016-02-12 DIAGNOSIS — G43A0 Cyclical vomiting, not intractable: Secondary | ICD-10-CM

## 2016-02-12 DIAGNOSIS — F419 Anxiety disorder, unspecified: Secondary | ICD-10-CM

## 2016-02-12 DIAGNOSIS — R1115 Cyclical vomiting syndrome unrelated to migraine: Secondary | ICD-10-CM

## 2016-02-12 HISTORY — DX: Pain in left shoulder: M25.512

## 2016-02-12 HISTORY — DX: Anxiety disorder, unspecified: F41.9

## 2016-02-12 MED ORDER — LORAZEPAM 0.5 MG PO TABS: 0.5000 mg | ORAL_TABLET | Freq: Three times a day (TID) | ORAL | Status: DC | PRN

## 2016-02-12 NOTE — Assessment & Plan Note (Signed)
Encouraged moist heat and gentle stretching as tolerated. May try NSAIDs and prescription meds as directed and report if symptoms worsen or seek immediate care 

## 2016-02-12 NOTE — Progress Notes (Signed)
Subjective:    Patient ID: Scott Molina, male    DOB: [birth year], 35 y.o.   MRN: [id number]  Chief Complaint  Patient presents with  . Follow-up    HPI Patient is in today for follow up.  Patient presents today with some concerns with palpations.   He is waking up from sleep with palpations, patient also reports the mediation for anxiety is not helping improve the palpations.  Patient reports some muscle tightness in neck and upper back area with anxiety.  Patient reports he has stopped drinking caffeine to see if that helps with anxiety anda adrenaline. Patient still having some concerns with gallbladder, reports episodes of heart burn, but symptoms haves since then subsided.  Patient has some concerns with ankle pain and also reports some concerns with burning eyes and some congestion and sinus pressure.  Denies CPSOB/HA/congestion/fevers/GI or GU c/o. Taking meds as prescribed.  Past Medical History  Diagnosis Date  . GERD (gastroesophageal reflux disease)   . Allergy   . Anxiety     At [employer]  . History of MRSA infection   . Preventative health care 10/29/2015  . Decreased visual acuity 10/29/2015  . Nausea with vomiting 10/29/2015  . Heartburn 10/29/2015  . Hemorrhoid 10/29/2015  . Seasonal allergies 10/29/2015  . Low back pain 11/08/2015  . Left varicocele 11/08/2015  . Left shoulder pain 02/12/2016    Past Surgical History  Procedure Laterality Date  . Tonsillectomy      Family History  Problem Relation Age of Onset  . Colon cancer Neg Hx   . Prostate cancer Neg Hx   . Testicular cancer    . Diabetes Mother   . Diabetes Father   . Hepatitis C Father   . Arthritis Father     back pain  . Arthritis Sister     knees and back  . Arthritis Brother   . Arthritis Maternal Uncle     wear and tear  . Cancer Paternal Grandmother     ? form    Social History   Social History  . Marital Status: Single    Spouse Name: N/A  . Number of Children: 0  . Years of  Education: N/A   Occupational History  . 2 jobs    Social History Main Topics  . Smoking status: Former Smoker    Types: Cigars, Pipe  . Smokeless tobacco: Not on file     Comment: CIGARS  . Alcohol Use: 0.0 oz/week    0 Standard drinks or equivalent per week  . Drug Use: Not on file  . 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 Not on file   Social History Narrative   Live w/ family    Outpatient Prescriptions Prior to Visit  Medication Sig Dispense Refill  . omeprazole (PRILOSEC) 40 MG capsule Take 1 capsule (40 mg total) by mouth daily. 30 capsule 3  . LORazepam (ATIVAN) 0.5 MG tablet Take 1 tablet (0.5 mg total) by mouth every 8 (eight) hours as needed for anxiety. 30 tablet 0   No facility-administered medications prior to visit.    Allergies  Allergen Reactions  . Aspartame And Phenylalanine   . Aspergum [Aspirin]     This is in gum    Review of Systems  Constitutional: Negative for fever and malaise/fatigue.  HENT: Positive for congestion.  Eyes: Negative for blurred vision.  Respiratory: Negative for shortness of breath.   Cardiovascular: Positive for palpitations. Negative for chest pain and leg swelling.  Gastrointestinal: Negative for nausea, abdominal pain and blood in stool.  Genitourinary: Negative for dysuria and frequency.  Musculoskeletal: Positive for neck pain. Negative for falls.  Skin: Negative for rash.  Neurological: Negative for dizziness, loss of consciousness and headaches.  Endo/Heme/Allergies: Negative for environmental allergies.  Psychiatric/Behavioral: Negative for depression. The patient is nervous/anxious.        Objective:    Physical Exam  Constitutional: He is oriented to person, place, and time. He appears well-developed and well-nourished. No distress.  HENT:  Head: Normocephalic and atraumatic.  Eyes:  Conjunctivae are normal.  Neck: Neck supple. No thyromegaly present.  Cardiovascular: Normal rate, regular rhythm and normal heart sounds.   No murmur heard. Pulmonary/Chest: Effort normal and breath sounds normal. No respiratory distress. He has no wheezes.  Abdominal: Soft. Bowel sounds are normal. He exhibits no mass. There is no tenderness.  Musculoskeletal: He exhibits no edema.  Lymphadenopathy:    He has no cervical adenopathy.  Neurological: He is alert and oriented to person, place, and time.  Skin: Skin is warm and dry.  Psychiatric: He has a normal mood and affect. His behavior is normal.    BP 120/80 mmHg  Pulse 97  Temp(Src) 98.2 F (36.8 C) (Oral)  Ht 5\' 11"  (1.803 m)  Wt 186 lb 2 oz (84.426 kg)  BMI 25.97 kg/m2  SpO2 96% Wt Readings from Last 3 Encounters:  02/12/16 186 lb 2 oz (84.426 kg)  01/11/16 190 lb 3.2 oz (86.274 kg)  10/29/15 193 lb (87.544 kg)     Lab Results  Component Value Date   WBC 8.3 01/11/2016   HGB 17.4* 01/11/2016   HCT 50.7 01/11/2016   PLT 304.0 01/11/2016   GLUCOSE 102* 01/11/2016   NA 141 01/11/2016   K 3.7 01/11/2016   CL 105 01/11/2016   CREATININE 0.86 01/11/2016   BUN 12 01/11/2016   CO2 28 01/11/2016   TSH 1.86 01/11/2016    Lab Results  Component Value Date   TSH 1.86 01/11/2016   Lab Results  Component Value Date   WBC 8.3 01/11/2016   HGB 17.4* 01/11/2016   HCT 50.7 01/11/2016   MCV 87.0 01/11/2016   PLT 304.0 01/11/2016   Lab Results  Component Value Date   NA 141 01/11/2016   K 3.7 01/11/2016   CO2 28 01/11/2016   GLUCOSE 102* 01/11/2016   BUN 12 01/11/2016   CREATININE 0.86 01/11/2016   CALCIUM 9.6 01/11/2016   GFR 109.11 01/11/2016   No results found for: CHOL No results found for: HDL No results found for: LDLCALC No results found for: TRIG No results found for: CHOLHDL No results found for: [id number]     Assessment & Plan:   Problem List Items Addressed This Visit    Palpitations - Primary     Appear to be anxiety related. Encouraged to avoid caffeine, maintain regular exercise may use Lorazepam prn and consider an SSRI      Nausea with vomiting    Mild nausea intermittently but no further vomiting      Low back pain    Encouraged moist heat and gentle stretching as tolerated. May try NSAIDs and prescription meds as directed and report if symptoms worsen or seek immediate care      Left shoulder pain    May try topical treatments  and is referred to sports med for further consideration      Heartburn    Avoid offending foods, start probiotics such as NOW . Do not eat large meals in late evening and consider raising head of bed.          I am having Mr. Borg maintain his omeprazole and LORazepam.  Meds ordered this encounter  Medications  . LORazepam (ATIVAN) 0.5 MG tablet    Sig: Take 1 tablet (0.5 mg total) by mouth every 8 (eight) hours as needed for anxiety.    Dispense:  30 tablet    Refill:  1     Danise Edge, MD

## 2016-02-12 NOTE — Progress Notes (Signed)
Pre visit review using our clinic review tool, if applicable. No additional management support is needed unless otherwise documented below in the visit note. 

## 2016-02-12 NOTE — Patient Instructions (Addendum)
Call for ref to sports medicine if interested.   See an [facility]. NOW Probiotic Vitamin. Available at [facility]. Allergies An allergy is an abnormal reaction to a substance by the body's defense system (immune system). Allergies can develop at any age. WHAT CAUSES ALLERGIES? An allergic reaction happens when the immune system mistakenly reacts to a normally harmless substance, called an allergen, as if it were harmful. The immune system releases antibodies to fight the substance. Antibodies eventually release a chemical called histamine into the bloodstream. The release of histamine is meant to protect the body from infection, but it also causes discomfort. An allergic reaction can be triggered by:  Eating an allergen.  Inhaling an allergen.  Touching an allergen. WHAT TYPES OF ALLERGIES ARE THERE? There are many types of allergies. Common types include:  Seasonal allergies. People with this type of allergy are usually allergic to substances that are only present during certain seasons, such as molds and pollens.  Food allergies.  Drug allergies.  Insect allergies.  Animal dander allergies. WHAT ARE SYMPTOMS OF ALLERGIES? Possible allergy symptoms include:  Swelling of the lips, face, tongue, mouth, or throat.  Sneezing, coughing, or wheezing.  Nasal congestion.  Tingling in the mouth.  Rash.  Itching.  Itchy, red, swollen areas of skin (hives).  Watery eyes.  Vomiting.  Diarrhea.  Dizziness.  Lightheadedness.  Fainting.  Trouble breathing or swallowing.  Chest tightness.  Rapid heartbeat. HOW ARE ALLERGIES DIAGNOSED? Allergies are diagnosed with a medical and family history and one or more of the following:  Skin tests.  Blood tests.  A food diary. A food diary is a record of all the foods and drinks you have in a day and of all the symptoms you experience.  The results of an elimination diet. An elimination diet involves  eliminating foods from your diet and then adding them back in one by one to find out if a certain food causes an allergic reaction. HOW ARE ALLERGIES TREATED? There is no cure for allergies, but allergic reactions can be treated with medicine. Severe reactions usually need to be treated at a hospital. HOW CAN REACTIONS BE PREVENTED? The best way to prevent an allergic reaction is by avoiding the substance you are allergic to. Allergy shots and medicines can also help prevent reactions in some cases. People with severe allergic reactions may be able to prevent a life-threatening reaction called anaphylaxis with a medicine given right after exposure to the allergen.   This information is not intended to replace advice given to you by your health care provider. Make sure you discuss any questions you have with your health care provider.   Document Released: 12/20/2002 Document Revised: 10/17/2014 Document Reviewed: 07/08/2014 Elsevier Interactive Patient Education [employer]2016 [employer] Inc.

## 2016-02-12 NOTE — Assessment & Plan Note (Addendum)
Avoid offending foods, start probiotics such as NOW . Do not eat large meals in late evening and consider raising head of bed.

## 2016-02-13 NOTE — Assessment & Plan Note (Signed)
Mild nausea intermittently but no further vomiting

## 2016-02-13 NOTE — Assessment & Plan Note (Signed)
Appear to be anxiety related. Encouraged to avoid caffeine, maintain regular exercise may use Lorazepam prn and consider an SSRI

## 2016-02-13 NOTE — Assessment & Plan Note (Signed)
May try topical treatments and is referred to sports med for further consideration

## 2016-03-04 ENCOUNTER — Other Ambulatory Visit: Payer: Self-pay | Admitting: Family Medicine

## 2016-03-04 ENCOUNTER — Telehealth: Payer: Self-pay | Admitting: Family Medicine

## 2016-03-04 DIAGNOSIS — R110 Nausea: Secondary | ICD-10-CM

## 2016-03-04 DIAGNOSIS — R1013 Epigastric pain: Secondary | ICD-10-CM

## 2016-03-04 NOTE — Telephone Encounter (Signed)
I have ordered an abdominal ultrasound, if that does not give us a diagnosis and he continues to feel bad he will need to come back in for evaluation

## 2016-03-04 NOTE — Telephone Encounter (Signed)
Patient informed referral done.

## 2016-03-04 NOTE — Telephone Encounter (Signed)
Caller name: Self  Can be reached: [phone]  Reason for call: Request call back to find out if he needs to have an US done. States he was told to call if he did not start feeling any better.

## 2016-03-05 ENCOUNTER — Ambulatory Visit (HOSPITAL_BASED_OUTPATIENT_CLINIC_OR_DEPARTMENT_OTHER)
Admission: RE | Admit: 2016-03-05 | Discharge: 2016-03-05 | Disposition: A | Discharge status: Home / Self Care | Payer: BLUE CROSS/BLUE SHIELD | Source: Ambulatory Visit | Attending: Family Medicine | Admitting: Family Medicine

## 2016-03-05 DIAGNOSIS — K8020 Calculus of gallbladder without cholecystitis without obstruction: Secondary | ICD-10-CM | POA: Diagnosis not present

## 2016-03-05 DIAGNOSIS — R1013 Epigastric pain: Secondary | ICD-10-CM | POA: Insufficient documentation

## 2016-03-05 DIAGNOSIS — R110 Nausea: Secondary | ICD-10-CM | POA: Diagnosis not present

## 2016-03-08 ENCOUNTER — Other Ambulatory Visit: Payer: Self-pay | Admitting: Family Medicine

## 2016-03-08 DIAGNOSIS — R109 Unspecified abdominal pain: Secondary | ICD-10-CM

## 2016-03-08 DIAGNOSIS — K8020 Calculus of gallbladder without cholecystitis without obstruction: Secondary | ICD-10-CM

## 2016-03-17 DIAGNOSIS — K219 Gastro-esophageal reflux disease without esophagitis: Secondary | ICD-10-CM | POA: Diagnosis not present

## 2016-03-17 DIAGNOSIS — K8020 Calculus of gallbladder without cholecystitis without obstruction: Secondary | ICD-10-CM | POA: Diagnosis not present

## 2016-04-25 ENCOUNTER — Ambulatory Visit: Payer: BLUE CROSS/BLUE SHIELD | Admitting: Family Medicine

## 2016-04-25 IMAGING — US US ART/VEN ABD/PELV/SCROTUM DOPPLER LTD
1 series · 13 of 25 positions shown · non-contrast
Comparison: Scrotal ultrasound January 21, 2005

CLINICAL DATA: Palpable superior scrotal lump on the left since
4001, mild discomfort, some change in sensation along the inferior
aspect of the left scrotum.

EXAM:
SCROTAL ULTRASOUND
DOPPLER ULTRASOUND OF THE TESTICLES
TECHNIQUE: Complete ultrasound examination of the testicles, epididymis, and
other scrotal structures was performed. Color and spectral Doppler
ultrasound were also utilized to evaluate blood flow to the
testicles.

[Series 1: us art/ven abd/pelv/scrotum doppler ltd · 0.07mm/px · 13 of 56 slices shown]
[im 1/56]
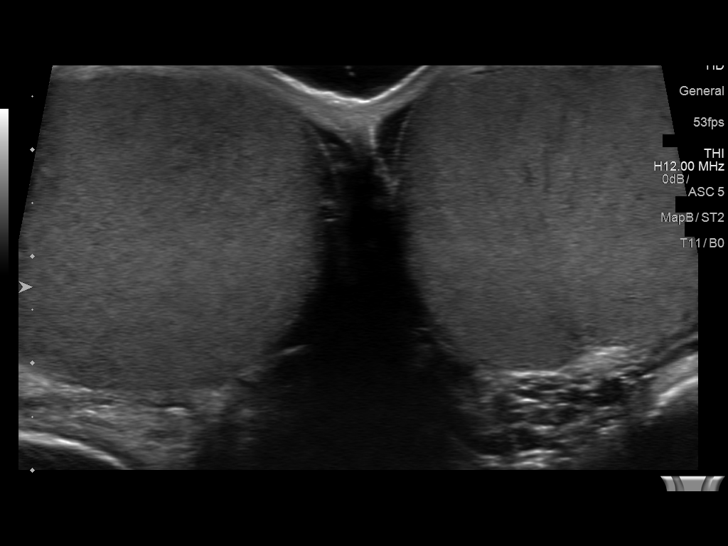
[im 5/56]
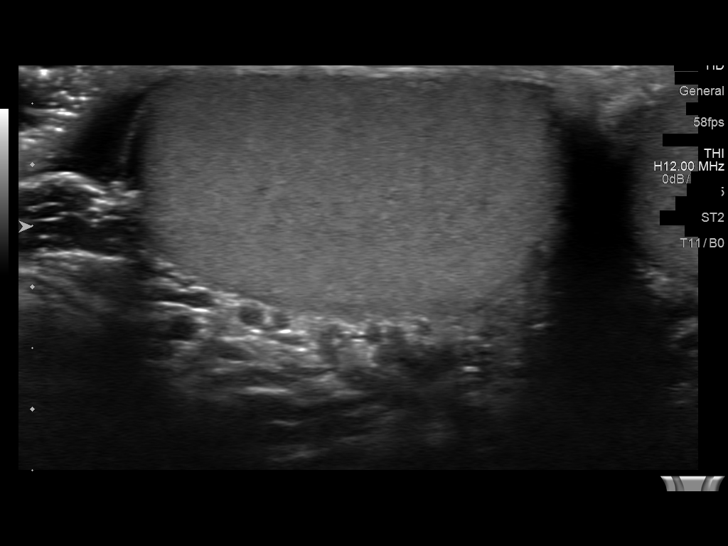
[im 10/56]
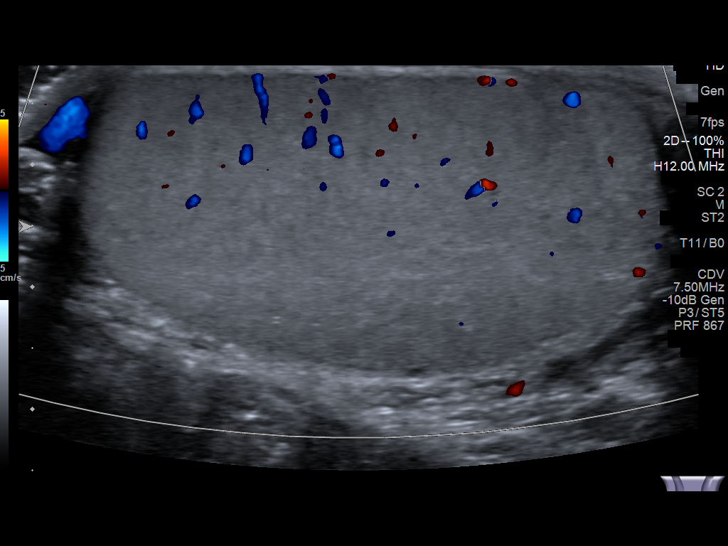
[im 14/56]
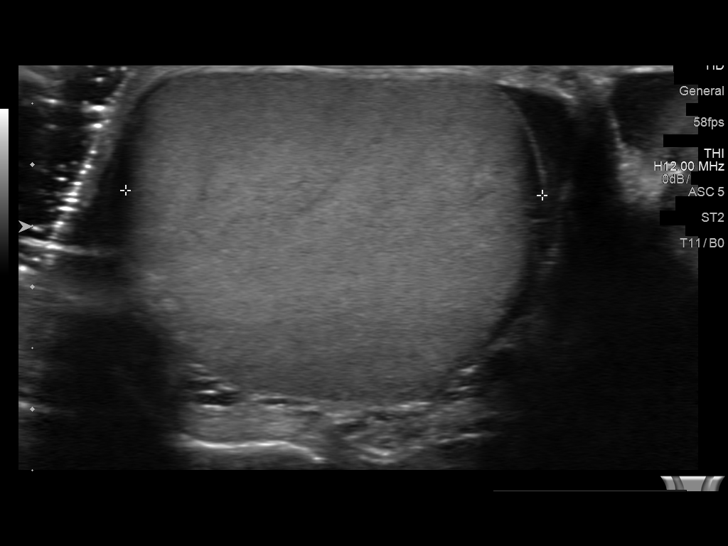
[im 19/56]
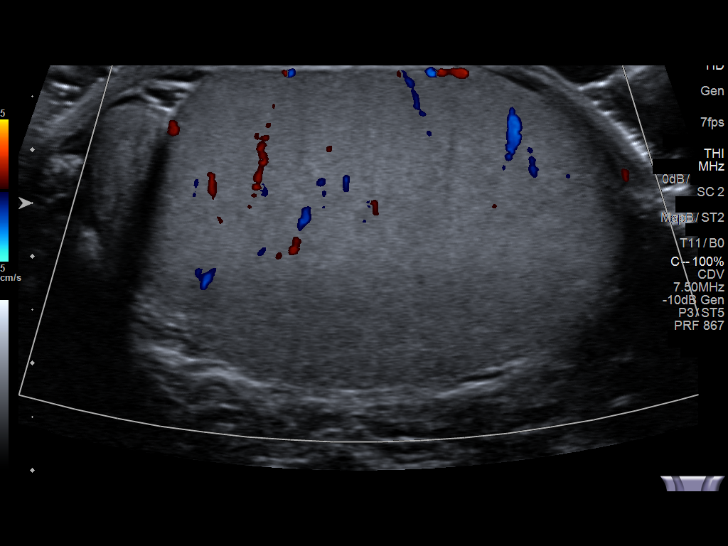
[im 23/56]
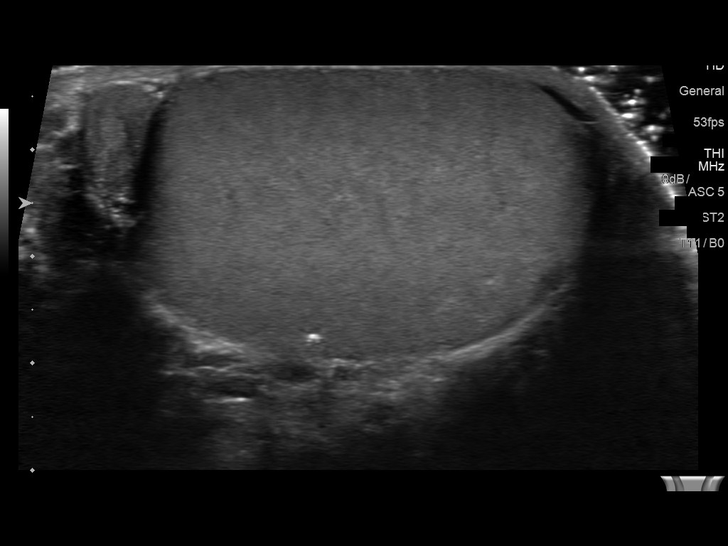
[im 28/56]
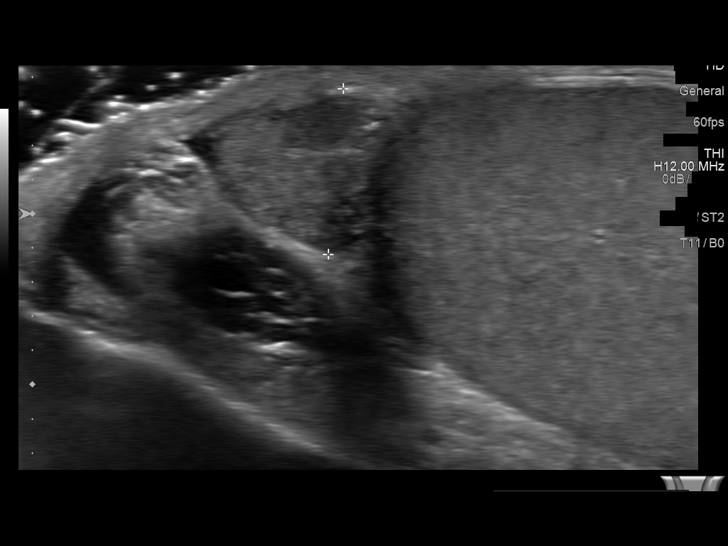
[im 33/56]
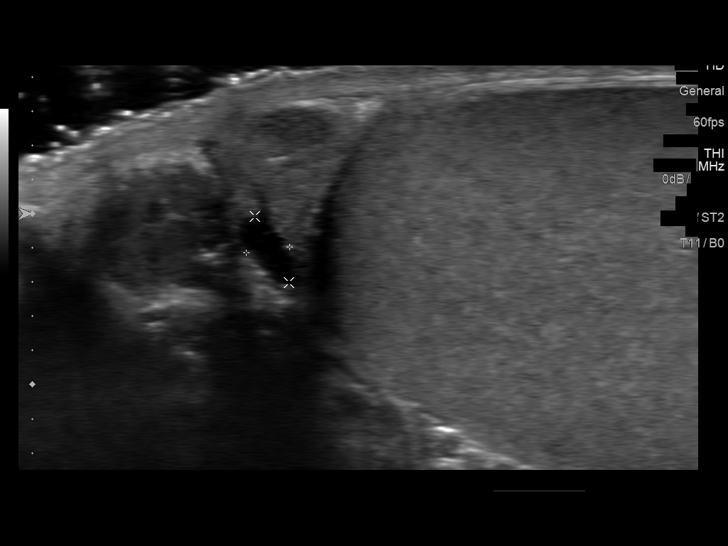
[im 37/56]
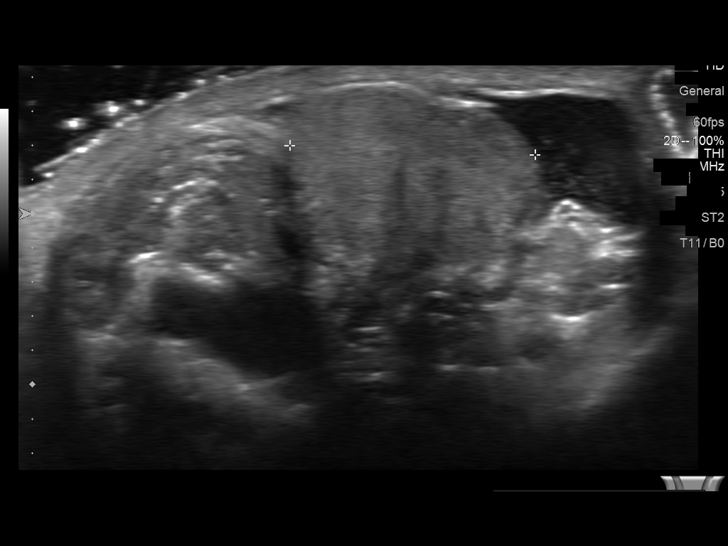
[im 42/56]
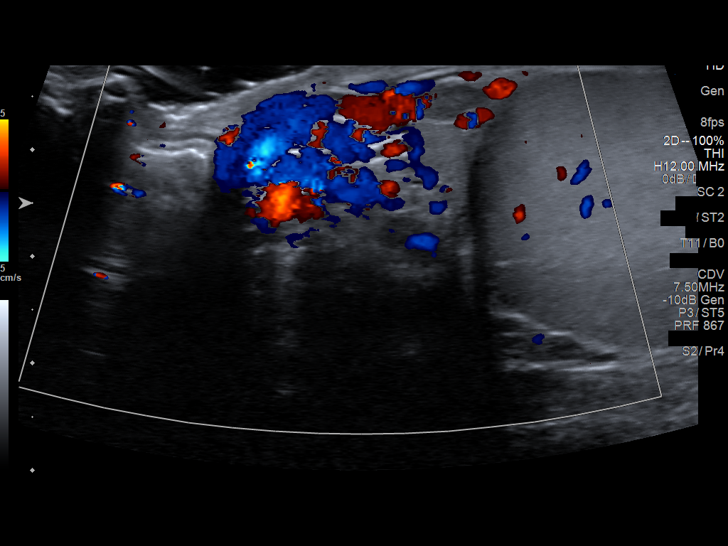
[im 46/56]
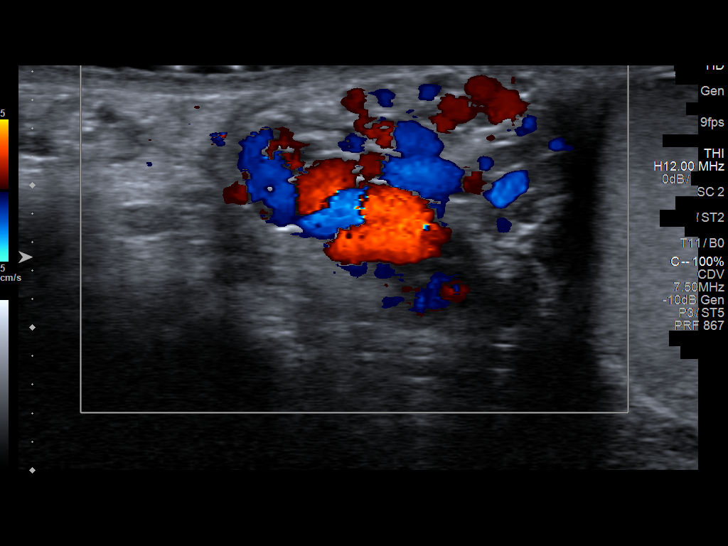
[im 51/56]
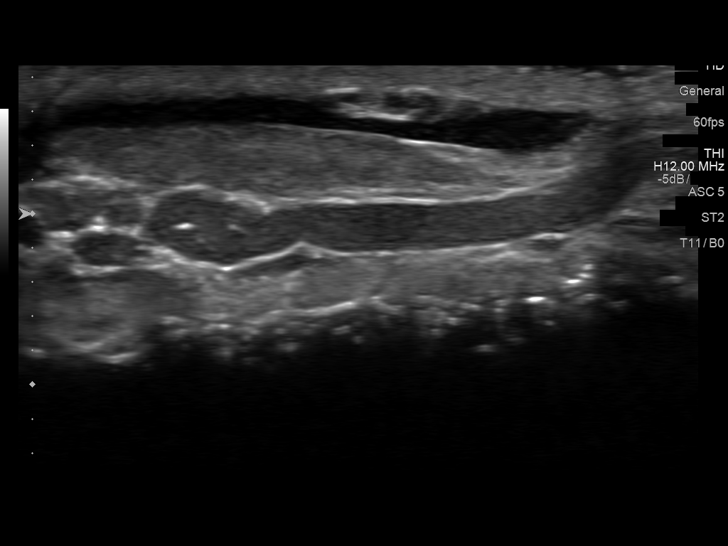
[im 56/56]
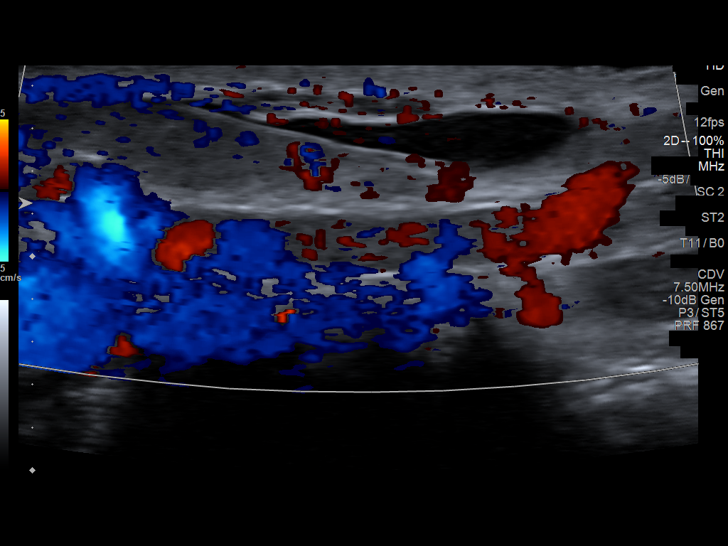

[13 of 25 positions shown; findings below may reference images not displayed]

FINDINGS: Right testicle

Measurements: 4.9 x 2.5 x 3.4 cm. No mass or microlithiasis
visualized.

Left testicle

Measurements: 4.4 x 2.7 x 3.2 cm. There is a 1 mm hyperechoic focus
within the substance of the left testicle likely reflecting a
microlith.

Right epididymis: There is a 4 mm cyst in the right epididymis.
Vascularity of the epididymis is normal.

Left epididymis:  Normal in size and appearance.

Hydrocele:  None visualized.

Varicocele: There is a left-sided varicocele which appears to
correspond to the palpable finding. A portion of this may be
thrombosed.

Pulsed Doppler interrogation of both testes demonstrates normal low
resistance arterial and venous waveforms bilaterally.
IMPRESSION: 1. There is no intra testicular mass. Testicular vascularity is
normal.
2. There is a 4 mm right epididymal cyst. There is no evidence of
acute epididymitis.
3. There is a left-sided varicocele which appears to correspond to
the palpable finding.

## 2016-05-13 ENCOUNTER — Ambulatory Visit: Payer: BLUE CROSS/BLUE SHIELD | Admitting: Family Medicine

## 2016-08-24 ENCOUNTER — Ambulatory Visit (HOSPITAL_BASED_OUTPATIENT_CLINIC_OR_DEPARTMENT_OTHER)
Admission: RE | Admit: 2016-08-24 | Discharge: 2016-08-24 | Disposition: A | Discharge status: Home / Self Care | Payer: BLUE CROSS/BLUE SHIELD | Source: Ambulatory Visit | Attending: Medical | Admitting: Medical

## 2016-08-24 ENCOUNTER — Ambulatory Visit (INDEPENDENT_AMBULATORY_CARE_PROVIDER_SITE_OTHER): Payer: BLUE CROSS/BLUE SHIELD | Admitting: Medical

## 2016-08-24 VITALS — BP 111/81 | HR 110 | Temp 98.9°F | Ht 71.0 in | Wt 193.8 lb

## 2016-08-24 DIAGNOSIS — R109 Unspecified abdominal pain: Secondary | ICD-10-CM

## 2016-08-24 DIAGNOSIS — K219 Gastro-esophageal reflux disease without esophagitis: Secondary | ICD-10-CM | POA: Diagnosis not present

## 2016-08-24 DIAGNOSIS — R0789 Other chest pain: Secondary | ICD-10-CM

## 2016-08-24 DIAGNOSIS — K760 Fatty (change of) liver, not elsewhere classified: Secondary | ICD-10-CM | POA: Insufficient documentation

## 2016-08-24 DIAGNOSIS — S46812A Strain of other muscles, fascia and tendons at shoulder and upper arm level, left arm, initial encounter: Secondary | ICD-10-CM

## 2016-08-24 DIAGNOSIS — R1011 Right upper quadrant pain: Secondary | ICD-10-CM | POA: Diagnosis not present

## 2016-08-24 DIAGNOSIS — K8020 Calculus of gallbladder without cholecystitis without obstruction: Secondary | ICD-10-CM | POA: Diagnosis not present

## 2016-08-24 DIAGNOSIS — R1013 Epigastric pain: Secondary | ICD-10-CM | POA: Insufficient documentation

## 2016-08-24 LAB — CBC WITH DIFFERENTIAL/PLATELET
Basophils Absolute: 0 10*3/uL (ref 0.0–0.1)
Basophils Relative: 0.4 % (ref 0.0–3.0)
EOS PCT: 2 % (ref 0.0–5.0)
Eosinophils Absolute: 0.1 10*3/uL (ref 0.0–0.7)
HEMATOCRIT: 50.9 % (ref 39.0–52.0)
Hemoglobin: 17.7 g/dL — ABNORMAL HIGH (ref 13.0–17.0)
LYMPHS ABS: 1.8 10*3/uL (ref 0.7–4.0)
LYMPHS PCT: 28.4 % (ref 12.0–46.0)
MCHC: 34.8 g/dL (ref 30.0–36.0)
MCV: 86.9 fl (ref 78.0–100.0)
MONOS PCT: 9.2 % (ref 3.0–12.0)
Monocytes Absolute: 0.6 10*3/uL (ref 0.1–1.0)
NEUTROS ABS: 3.9 10*3/uL (ref 1.4–7.7)
NEUTROS PCT: 60 % (ref 43.0–77.0)
PLATELETS: 290 10*3/uL (ref 150.0–400.0)
RBC: 5.86 Mil/uL — AB (ref 4.22–5.81)
RDW: 13 % (ref 11.5–15.5)
WBC: 6.4 10*3/uL (ref 4.0–10.5)

## 2016-08-24 LAB — COMPREHENSIVE METABOLIC PANEL
ALK PHOS: 81 U/L (ref 39–117)
ALT: 22 U/L (ref 0–53)
AST: 18 U/L (ref 0–37)
Albumin: 4.6 g/dL (ref 3.5–5.2)
BILIRUBIN TOTAL: 1.3 mg/dL — AB (ref 0.2–1.2)
BUN: 14 mg/dL (ref 6–23)
CALCIUM: 9.5 mg/dL (ref 8.4–10.5)
CO2: 29 mEq/L (ref 19–32)
Chloride: 103 mEq/L (ref 96–112)
Creatinine, Ser: 0.97 mg/dL (ref 0.40–1.50)
GFR: 94.6 mL/min (ref >60.00)
GLUCOSE: 91 mg/dL (ref 70–99)
POTASSIUM: 3.6 meq/L (ref 3.5–5.1)
Sodium: 139 mEq/L (ref 135–145)
TOTAL PROTEIN: 7.2 g/dL (ref 6.0–8.3)

## 2016-08-24 LAB — LIPASE: LIPASE: 22 U/L (ref 11.0–59.0)

## 2016-08-24 LAB — TROPONIN I: TNIDX: 0 ug/L (ref 0.00–0.06)

## 2016-08-24 LAB — AMYLASE: Amylase: 41 U/L (ref 27–131)

## 2016-08-24 MED ORDER — LORAZEPAM 0.5 MG PO TABS: 0.5000 mg | ORAL_TABLET | Freq: Three times a day (TID) | ORAL | 0 refills | Status: DC | PRN

## 2016-08-24 MED ORDER — CYCLOBENZAPRINE HCL 5 MG PO TABS: 5.0000 mg | ORAL_TABLET | Freq: Every day | ORAL | 0 refills | Status: DC

## 2016-08-24 MED ORDER — SERTRALINE HCL 25 MG PO TABS: 25.0000 mg | ORAL_TABLET | Freq: Every day | ORAL | 0 refills | Status: DC

## 2016-08-24 MED ORDER — OMEPRAZOLE 40 MG PO CPDR: 40.0000 mg | DELAYED_RELEASE_CAPSULE | Freq: Every day | ORAL | 3 refills | Status: DC

## 2016-08-24 NOTE — Progress Notes (Signed)
Subjective:    Patient ID: Scott Molina, male    DOB: Jan 31, 1981, 35 y.o.   MRN: [id number]  HPI  Pt in for some pain in his back. He points to his left upper back. He points to left trapezius region. Some pain that radiated to his left arm briefly. 2 weeks of some back pain in this area. Advil did not help. Massage did not help. No injury or fall.  Pt felt also that his left arm has been falling asleep at times the  Other day.  Pt states constant reflux/heart burn no matter water he eats. Feels like he needs to belch. Wife states unrelenting for one week. Yesterday some rt upper quadrant pain yesterday mild pain. But not today. Pepcid complete did not help reflux.  Pt has anxiety as well. Pt is on lorazepam. Has been using every 8 hour.  Pt states on one week ago felt some chest tightness on and off. Not presently. Last felt on Sunday. He had no ativan. Then used ativan and felt better.   Review of Systems  Constitutional: Negative for chills, fatigue and fever.  HENT: Negative for congestion, ear discharge, ear pain, sinus pain, sinus pressure and sore throat.   Respiratory: Negative for cough, shortness of breath and wheezing.   Cardiovascular: Negative for chest pain and palpitations.       See hpi.  Gastrointestinal: Positive for abdominal pain. Negative for constipation, diarrhea, nausea and vomiting.       See hpi.  Musculoskeletal: Negative for back pain, myalgias and neck pain.       Lt trapezius pain.  Skin: Negative for rash.  Neurological: Negative for dizziness, tremors, seizures, facial asymmetry, light-headedness and numbness.  Hematological: Negative for adenopathy. Does not bruise/bleed easily.  Psychiatric/Behavioral: Negative for behavioral problems, confusion, dysphoric mood, sleep disturbance and suicidal ideas. The patient is nervous/anxious.     Past Medical History:  Diagnosis Date  . Allergy   . [facility]  . Decreased visual  acuity 10/29/2015  . GERD (gastroesophageal reflux disease)   . Heartburn 10/29/2015  . Hemorrhoid 10/29/2015  . History of MRSA infection   . Left shoulder pain 02/12/2016  . Left varicocele 11/08/2015  . Low back pain 11/08/2015  . Nausea with vomiting 10/29/2015  . Preventative health care 10/29/2015  . Seasonal allergies 10/29/2015     Social History   Social History  . Marital status: Single    Spouse name: N/A  . Number of children: 0  . Years of education: N/A   Occupational History  . 2 jobs    Social History Main Topics  . Smoking status: Former Smoker    Types: Cigars, Pipe  . Smokeless tobacco: Not on file     Comment: CIGARS  . Alcohol use 0.0 oz/week  . Drug use: Unknown  . 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 Not on file   Social History Narrative   Live w/ family    Past Surgical History:  Procedure Laterality Date  . TONSILLECTOMY      Family History  Problem Relation Age of Onset  . Colon cancer Neg Hx   . Prostate cancer Neg Hx   . Testicular cancer    . Diabetes Mother   . Diabetes Father   . Hepatitis C Father   .  Arthritis Father     back pain  . Arthritis Sister     knees and back  . Arthritis Brother   . Arthritis Maternal Uncle     wear and tear  . Cancer Paternal Grandmother     ? form    Allergies  Allergen Reactions  . Aspartame And Phenylalanine   . Aspergum [Aspirin]     This is in gum    Current Outpatient Prescriptions on File Prior to Visit  Medication Sig Dispense Refill  . LORazepam (ATIVAN) 0.5 MG tablet Take 1 tablet (0.5 mg total) by mouth every 8 (eight) hours as needed for anxiety. 30 tablet 1  . omeprazole (PRILOSEC) 40 MG capsule Take 1 capsule (40 mg total) by mouth daily. (Patient not taking: Reported on 08/24/2016) 30 capsule 3   No current facility-administered medications on file  prior to visit.     BP 111/81   Pulse (!) 110   Temp 98.9 F (37.2 C) (Oral)   Ht 5\' 11"  (1.803 m)   Wt 193 lb 12.8 oz (87.9 kg)   SpO2 99%   BMI 27.03 kg/m       Objective:   Physical Exam  General Mental Status- Alert. General Appearance- Not in acute distress.   Skin General: Color- Normal Color. Moisture- Normal Moisture.  Neck Carotid Arteries- Normal color. Moisture- Normal Moisture. No carotid bruits. No JVD. Left trapezius tenderness. Tenderness to medial scapula. No mid cspine pain.  Chest and Lung Exam Auscultation: Breath Sounds:-Normal.  Cardiovascular Auscultation:Rythm- Regular. Murmurs & Other Heart Sounds:Auscultation of the heart reveals- No Murmurs.  Abdomen Inspection:-Inspeection Normal. Palpation/Percussion:Note:No mass. Palpation and Percussion of the abdomen reveal- Non Tender, Non Distended + BS, no rebound or guarding.    Neurologic Cranial Nerve exam:- CN III-XII intact(No nystagmus), symmetric smile. Strength:- 5/5 equal and symmetric strength both upper and lower extremities.  Left upper ext- negative phalens sign.      Assessment & Plan:  For your abdomen pain will get labs and US. Please go down stairs and get US scheduled.   For trapezius pain will rx ibuprofen  and flexeril low dose.  For atypical chest pain on Sunday you declined ekg but I did order troponin today.   For Reflux symptoms rx omeprazole and gerd diet.  For anxiety rx sertraline. Think about use. Rx ativan today as well.  Folow up in 10 days or as needed

## 2016-08-24 NOTE — Patient Instructions (Addendum)
For your abdomen pain will get labs and US. Please go down stairs and get US scheduled.   For trapezius pain will ibuprofen  and flexeril low dose.  For atypical chest pain on Sunday you declined ekg but I did order troponin today.   For Reflux symptoms rx omeprazole and gerd diet.  For anxiety rx sertraline. Think about use. Rx ativan today as well.  Follow up in 10 days or as needed.

## 2016-08-24 NOTE — Progress Notes (Signed)
Pre visit review using our clinic tool,if applicable. No additional management support is needed unless otherwise documented below in the visit note.  

## 2016-08-25 LAB — H. PYLORI BREATH TEST: H. pylori Breath Test: NOT DETECTED

## 2016-09-07 ENCOUNTER — Ambulatory Visit: Payer: BLUE CROSS/BLUE SHIELD | Admitting: Medical

## 2016-11-07 ENCOUNTER — Encounter: Payer: BLUE CROSS/BLUE SHIELD | Admitting: Family Medicine

## 2017-02-08 ENCOUNTER — Other Ambulatory Visit: Payer: Self-pay | Admitting: Medical

## 2017-02-13 NOTE — Telephone Encounter (Signed)
Caller name: Molli HazardMatthew Relation to pt: self Call back number:[phone] Pharmacy:  Reason for call: Pt called wanting  to know the status of his meds that was requesting last wk from the pharmacy on 02-08-2017 (omeprazole). Please advise ASAP.

## 2017-02-14 ENCOUNTER — Other Ambulatory Visit: Payer: Self-pay | Admitting: Family Medicine

## 2017-02-14 MED ORDER — OMEPRAZOLE 40 MG PO CPDR: 40.0000 mg | DELAYED_RELEASE_CAPSULE | Freq: Every day | ORAL | 5 refills | Status: DC

## 2017-03-08 IMAGING — US US ABDOMEN COMPLETE
1 series · 14 of 25 positions shown · non-contrast
Comparison: Testicular ultrasound 04/23/2015;

CLINICAL DATA: Patient with right upper quadrant pain for 2- 3
days.

EXAM:
ABDOMEN ULTRASOUND COMPLETE

[Series 1: us abdomen complete · 0.18mm/px · 14 of 100 slices shown]
[im 1/100]
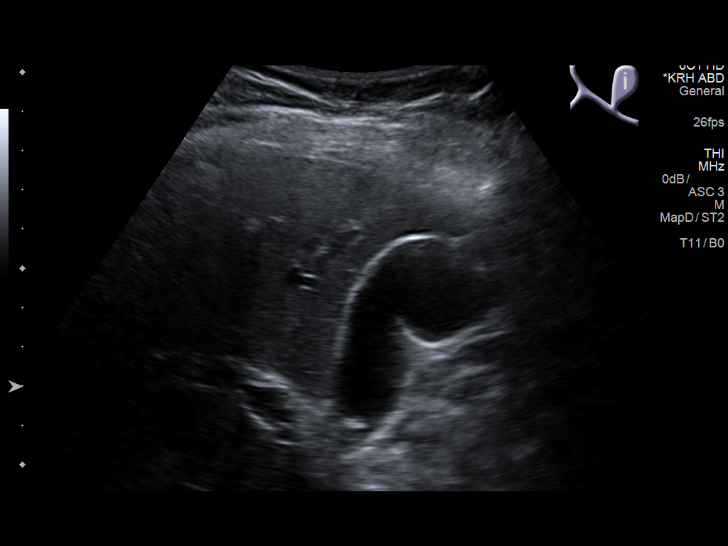
[im 9/100]
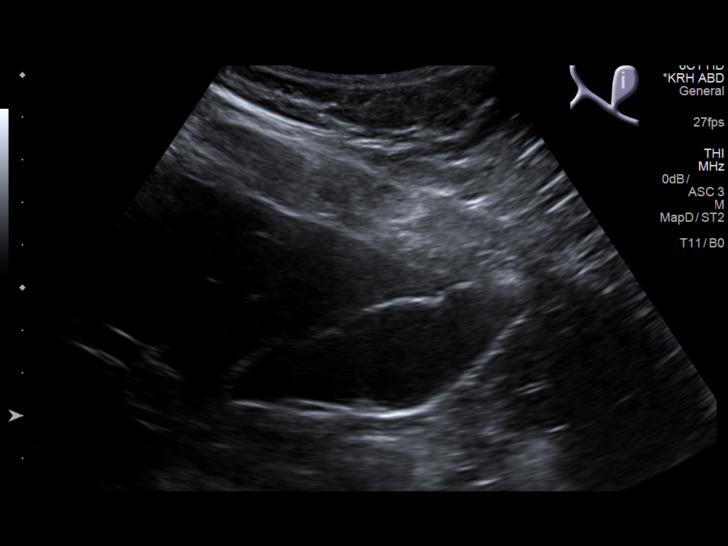
[im 17/100]
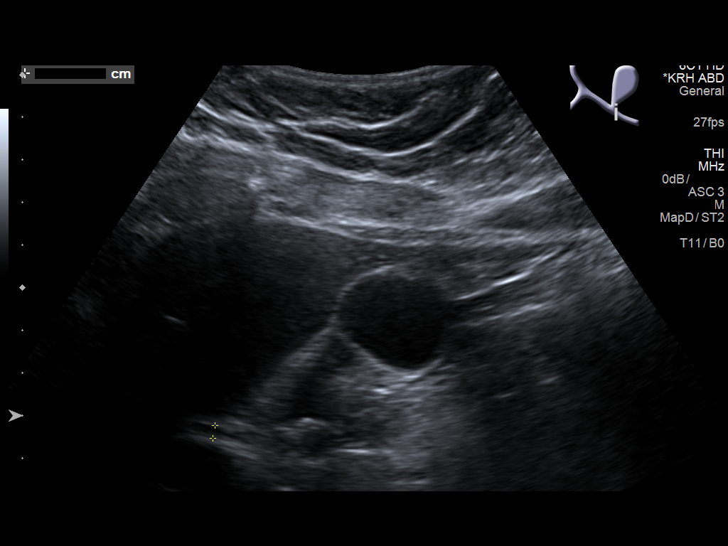
[im 25/100]
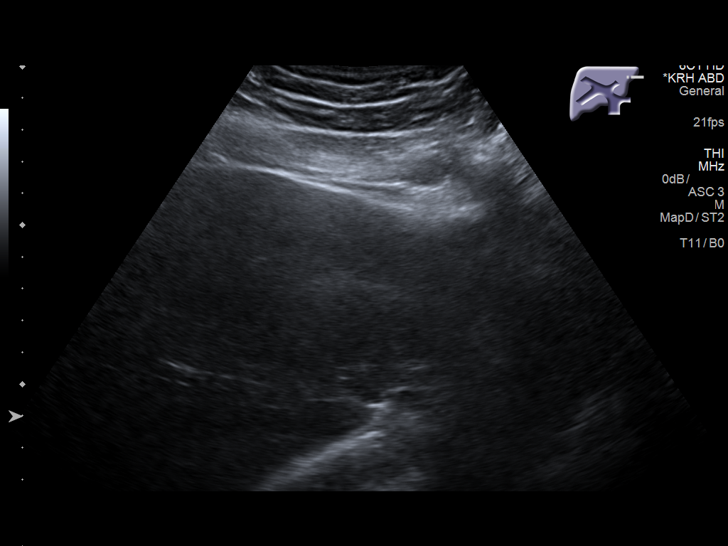
[im 34/100]
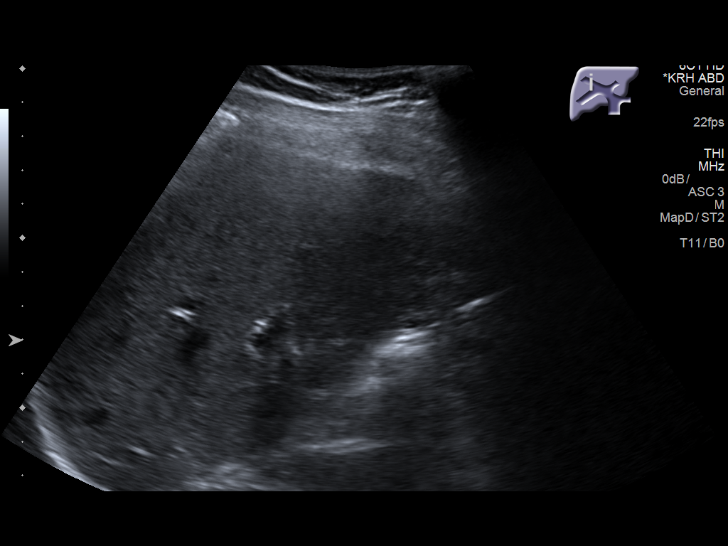
[im 38/100]
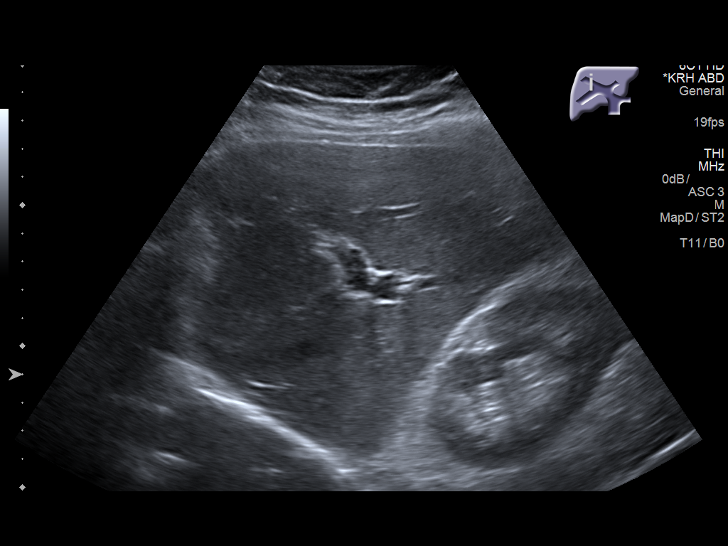
[im 46/100]
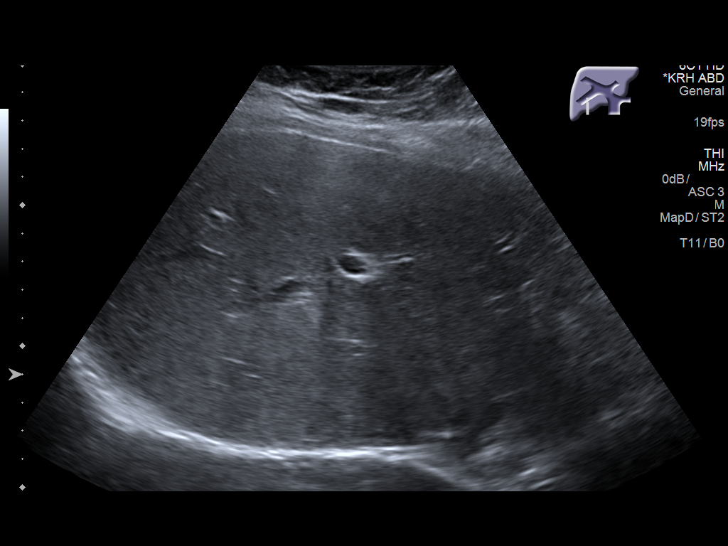
[im 54/100]
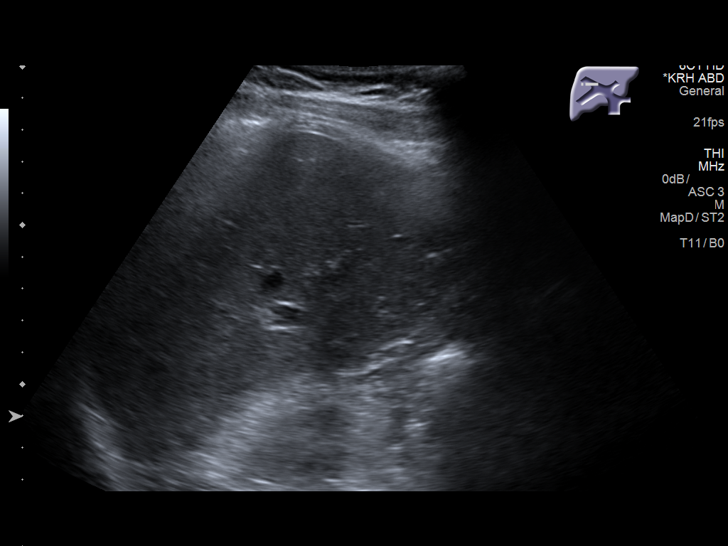
[im 62/100]
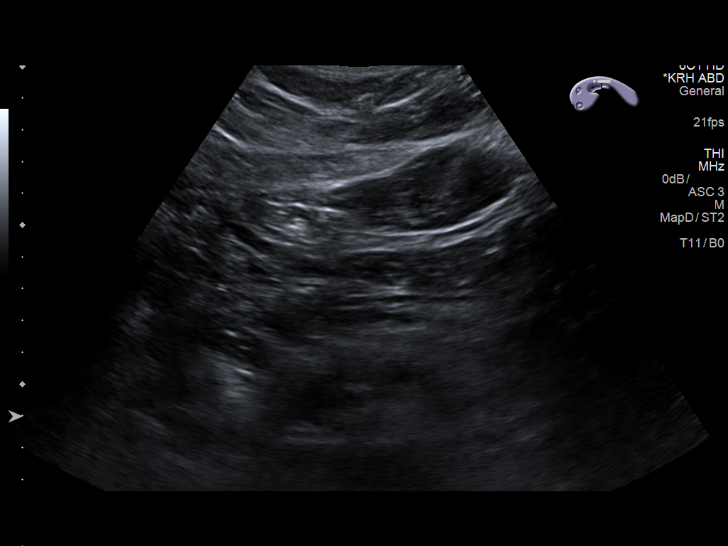
[im 67/100]
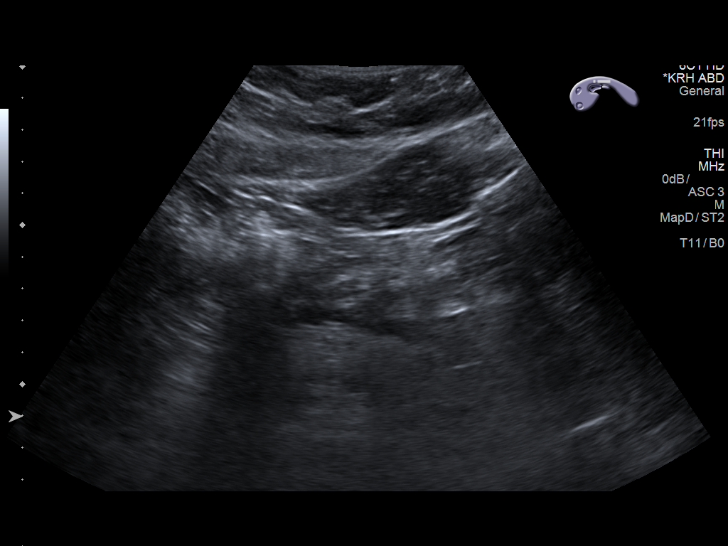
[im 75/100]
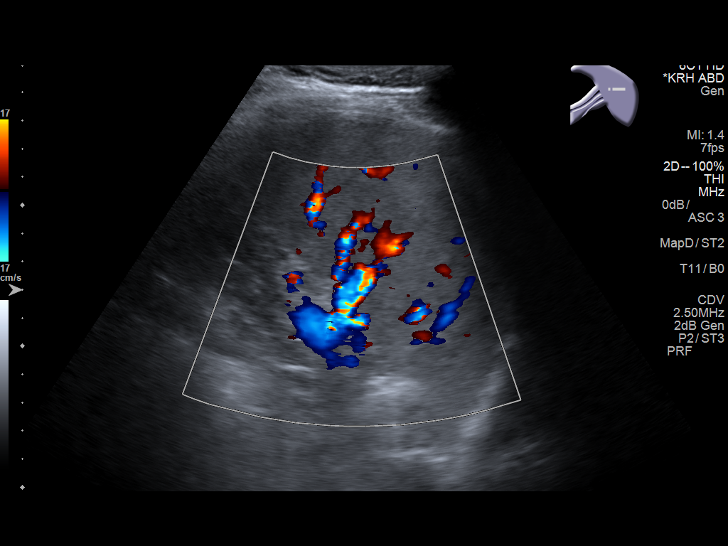
[im 83/100]
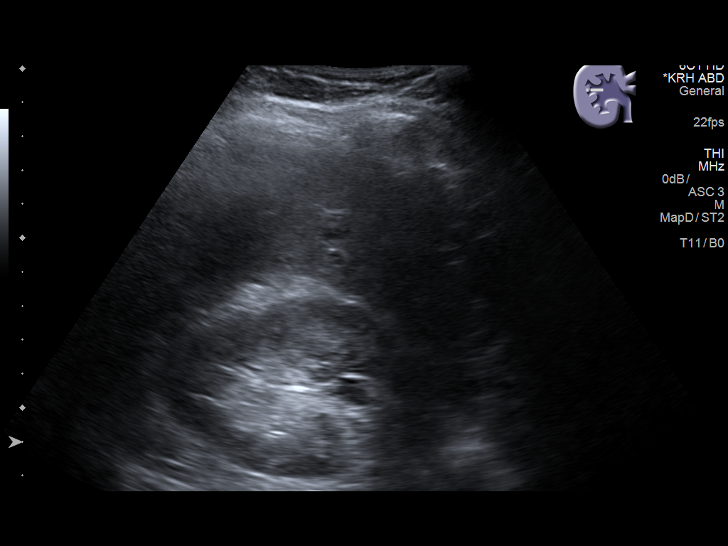
[im 91/100]
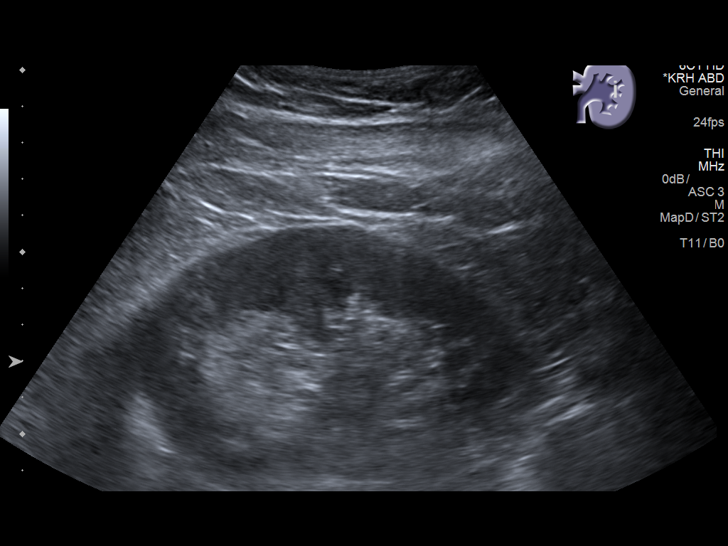
[im 100/100]
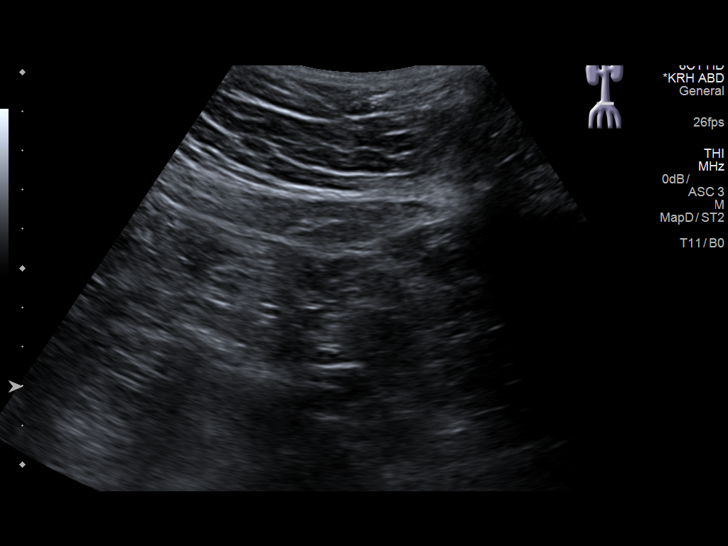

[14 of 25 positions shown; findings below may reference images not displayed]

FINDINGS: Gallbladder: Single non mobile stone within the gallbladder lumen.
No gallbladder wall thickening or pericholecystic fluid. Negative
sonographic Murphy's sign.

Common bile duct: Diameter: 3 mm

Liver: No focal lesion identified. Within normal limits in
parenchymal echogenicity.

IVC: No abnormality visualized.

Pancreas: Visualized portion unremarkable.

Spleen: Size and appearance within normal limits.

Right Kidney: Length: 9.9 cm. Echogenicity within normal limits. No
mass or hydronephrosis visualized.

Left Kidney: Length: 10.7 cm. Echogenicity within normal limits. No
mass or hydronephrosis visualized.

Abdominal aorta: No aneurysm visualized.

Other findings: None.
IMPRESSION: Cholelithiasis without sonographic evidence for acute cholecystitis.

## 2017-03-09 ENCOUNTER — Ambulatory Visit (INDEPENDENT_AMBULATORY_CARE_PROVIDER_SITE_OTHER): Payer: BLUE CROSS/BLUE SHIELD | Admitting: Family Medicine

## 2017-03-09 ENCOUNTER — Encounter: Payer: Self-pay | Admitting: Family Medicine

## 2017-03-09 VITALS — BP 117/72 | HR 93 | Temp 98.6°F | Ht 71.0 in | Wt 207.4 lb

## 2017-03-09 DIAGNOSIS — M545 Low back pain, unspecified: Secondary | ICD-10-CM

## 2017-03-09 DIAGNOSIS — K2100 Gastro-esophageal reflux disease with esophagitis, without bleeding: Secondary | ICD-10-CM

## 2017-03-09 DIAGNOSIS — F419 Anxiety disorder, unspecified: Secondary | ICD-10-CM | POA: Diagnosis not present

## 2017-03-09 DIAGNOSIS — E785 Hyperlipidemia, unspecified: Secondary | ICD-10-CM | POA: Diagnosis not present

## 2017-03-09 DIAGNOSIS — R12 Heartburn: Secondary | ICD-10-CM | POA: Diagnosis not present

## 2017-03-09 DIAGNOSIS — Z0000 Encounter for general adult medical examination without abnormal findings: Secondary | ICD-10-CM | POA: Diagnosis not present

## 2017-03-09 DIAGNOSIS — E782 Mixed hyperlipidemia: Secondary | ICD-10-CM

## 2017-03-09 DIAGNOSIS — R000 Tachycardia, unspecified: Secondary | ICD-10-CM

## 2017-03-09 MED ORDER — LORAZEPAM 0.5 MG PO TABS: 0.5000 mg | ORAL_TABLET | Freq: Three times a day (TID) | ORAL | Status: DC | PRN

## 2017-03-09 MED ORDER — CYCLOBENZAPRINE HCL 5 MG PO TABS: 5.0000 mg | ORAL_TABLET | Freq: Every day | ORAL | 1 refills | Status: DC

## 2017-03-09 NOTE — Progress Notes (Signed)
Patient ID: Scott Molina, male   DOB: 1981/06/20, 36 y.o.   MRN: [id number]   Subjective:  I acted as a Neurosurgeoncribe for Danise EdgeStacey Chanee Henrickson, MD. Diamond NickelBeatrice, ArizonaRMA   Patient ID: Scott PenceMatthew S Molina, male    DOB: 1981/06/20, 36 y.o.   MRN: [id number]  Chief Complaint  Patient presents with  . Annual Exam    HPI  Patient is in today for an annual examination. Patient has a Hx of heartburn, seasonal allergies. Patient has no recent febrile illness or hospitalizations. . Continues to struggle with epigastric and even substernal burning with heart burn. No change in bowel habits. Is noting some  Low back pain and right shoulder pain, no recent fall or injury. Denies CP/palp/SOB/HA/congestion/fevers or GU c/o. Taking meds as prescribed  Patient Care Team: Bradd CanaryBlyth, Hanah Moultry A, MD as PCP - General (Family Medicine)   Past Medical History:  Diagnosis Date  . Allergy   . [facility]    [facility]  . Anxiety 02/12/2016  . Decreased visual acuity 10/29/2015  . GERD (gastroesophageal reflux disease)   . Heartburn 10/29/2015  . Hemorrhoid 10/29/2015  . History of MRSA infection   . Hyperlipidemia 03/12/2017  . Left shoulder pain 02/12/2016  . Left varicocele 11/08/2015  . Low back pain 11/08/2015  . Nausea with vomiting 10/29/2015  . Preventative health care 10/29/2015  . Seasonal allergies 10/29/2015    Past Surgical History:  Procedure Laterality Date  . TONSILLECTOMY      Family History  Problem Relation Age of Onset  . Diabetes Mother   . Diabetes Father   . Hepatitis C Father   . Arthritis Father        back pain  . Arthritis Sister        knees and back  . Arthritis Brother   . Arthritis Maternal Uncle        wear and tear  . Cancer Paternal Grandmother        ? form  . Testicular cancer Unknown   . Colon cancer Neg Hx   . Prostate cancer Neg Hx     Social History   Social History  . Marital status: Single    Spouse name: N/A  . Number of children: 0  . Years of education: N/A    Occupational History  . 2 jobs    Social History Main Topics  . Smoking status: Former Smoker    Types: Cigars, Pipe  . Smokeless tobacco: Never Used     Comment: CIGARS  . Alcohol use 0.0 oz/week  . Drug use: No  . 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 Not on file   Social History Narrative   Live w/ family    Outpatient Medications Prior to Visit  Medication Sig Dispense Refill  . omeprazole (PRILOSEC) 40 MG capsule Take 1 capsule (40 mg total) by mouth daily. 30 capsule 5  . sertraline (ZOLOFT) 25 MG tablet Take 1 tablet (25 mg total) by mouth daily. 30 tablet 0  . cyclobenzaprine (FLEXERIL) 5 MG tablet Take 1 tablet (5 mg total) by mouth at bedtime. 7 tablet 0  . LORazepam (ATIVAN) 0.5 MG tablet Take 1 tablet (0.5 mg total) by mouth every 8 (eight) hours as needed for anxiety. 30 tablet 0   No facility-administered medications prior to visit.     Allergies  Allergen Reactions  . Aspartame And Phenylalanine   . Aspergum [Aspirin]     This is in gum    Review of Systems  Constitutional: Negative for fever and malaise/fatigue.  HENT: Negative for congestion.   Eyes: Negative for blurred vision.  Respiratory: Negative for cough and shortness of breath.   Cardiovascular: Negative for chest pain, palpitations and leg swelling.  Gastrointestinal: Negative for vomiting.  Musculoskeletal: Positive for back pain and joint pain.  Skin: Negative for rash.  Neurological: Negative for loss of consciousness and headaches.       Objective:    Physical Exam  Constitutional: He is oriented to person, place, and time. He appears well-developed and well-nourished. No distress.  HENT:  Head: Normocephalic and atraumatic.  Eyes: Conjunctivae are normal.  Neck: Normal range of motion. No thyromegaly present.  Cardiovascular: Normal rate and regular  rhythm.   Pulmonary/Chest: Effort normal and breath sounds normal. He has no wheezes.  Abdominal: Soft. Bowel sounds are normal. There is no tenderness.  Musculoskeletal: He exhibits no edema or deformity.  Neurological: He is alert and oriented to person, place, and time.  Skin: Skin is warm and dry. He is not diaphoretic.  Psychiatric: He has a normal mood and affect.    BP 117/72 (BP Location: Left Arm, Patient Position: Sitting, Cuff Size: Normal)   Pulse 93   Temp 98.6 F (37 C) (Oral)   Ht 5\' 11"  (1.803 m)   Wt 207 lb 6.4 oz (94.1 kg)   SpO2 98% Comment: RA  BMI 28.93 kg/m  Wt Readings from Last 3 Encounters:  03/09/17 207 lb 6.4 oz (94.1 kg)  08/24/16 193 lb 12.8 oz (87.9 kg)  02/12/16 186 lb 2 oz (84.4 kg)   BP Readings from Last 3 Encounters:  03/09/17 117/72  08/24/16 111/81  02/12/16 120/80      There is no immunization history on file for this patient.  Health Maintenance  Topic Date Due  . TETANUS/TDAP  06/09/2017 (Originally 05/27/2002)  . INFLUENZA VACCINE  05/10/2017  . HIV Screening  Addressed    Lab Results  Component Value Date   WBC 6.4 08/24/2016   HGB 17.7 (H) 08/24/2016   HCT 50.9 08/24/2016   PLT 290.0 08/24/2016   GLUCOSE 79 03/09/2017   CHOL 160 03/09/2017   TRIG (H) 03/09/2017    400.0 Triglyceride is over 400; calculations on Lipids are invalid.   HDL 34.50 (L) 03/09/2017   LDLDIRECT 88.0 03/09/2017   ALT 25 03/09/2017   AST 23 03/09/2017   NA 141 03/09/2017   K 4.0 03/09/2017   CL 106 03/09/2017   CREATININE 1.11 03/09/2017   BUN 18 03/09/2017   CO2 30 03/09/2017   TSH 1.52 03/09/2017    Lab Results  Component Value Date   TSH 1.52 03/09/2017   Lab Results  Component Value Date   WBC 6.4 08/24/2016   HGB 17.7 (H) 08/24/2016   HCT 50.9 08/24/2016   MCV 86.9 08/24/2016   PLT 290.0 08/24/2016   Lab Results  Component Value Date   NA 141 03/09/2017   K 4.0 03/09/2017   CO2 30 03/09/2017   GLUCOSE 79 03/09/2017    BUN 18 03/09/2017   CREATININE 1.11 03/09/2017   BILITOT 0.6 03/09/2017   ALKPHOS 96 03/09/2017   AST 23 03/09/2017   ALT 25 03/09/2017   PROT 7.0 03/09/2017   ALBUMIN 4.5 03/09/2017   CALCIUM 9.4 03/09/2017   GFR 80.70 03/09/2017  Lab Results  Component Value Date   CHOL 160 03/09/2017   Lab Results  Component Value Date   HDL 34.50 (L) 03/09/2017   No results found for: [facility] Lab Results  Component Value Date   TRIG (H) 03/09/2017    400.0 Triglyceride is over 400; calculations on Lipids are invalid.   Lab Results  Component Value Date   CHOLHDL 5 03/09/2017   No results found for: HGBA1C       Assessment & Plan:   Problem List Items Addressed This Visit    Preventative health care    Patient encouraged to maintain heart healthy diet, regular exercise, adequate sleep. Consider daily probiotics. Take medications as prescribed      Heartburn    Avoid offending foods, start probiotics. Do not eat large meals in late evening and consider raising head of bed. Omeprazole 40 mg daily      Low back pain    Encouraged moist heat and gentle stretching as tolerated. May try NSAIDs and prescription meds as directed and report if symptoms worsen or seek immediate care      Relevant Medications   cyclobenzaprine (FLEXERIL) 5 MG tablet   Anxiety    Good response to Sertraline and occasional Lorazepam can be continued.       Relevant Medications   LORazepam (ATIVAN) 0.5 MG tablet   Hyperlipidemia - Primary    Encouraged heart healthy diet, increase exercise, avoid trans fats, consider a krill oil cap daily      Relevant Orders   Lipid panel (Completed)    Other Visit Diagnoses    Gastroesophageal reflux disease with esophagitis       Relevant Orders   Comprehensive metabolic panel (Completed)   Tachycardia       Relevant Orders   TSH (Completed)      I am having Mr. Sandlin maintain his sertraline, omeprazole, fluticasone, LORazepam, and  cyclobenzaprine.  Meds ordered this encounter  Medications  . fluticasone (FLONASE) 50 MCG/ACT nasal spray    Sig: Place into both nostrils daily.  Marland Kitchen LORazepam (ATIVAN) 0.5 MG tablet    Sig: Take 1 tablet (0.5 mg total) by mouth every 8 (eight) hours as needed for anxiety.    Dispense:  30 tablet    Refill:  01  . cyclobenzaprine (FLEXERIL) 5 MG tablet    Sig: Take 1 tablet (5 mg total) by mouth at bedtime.    Dispense:  30 tablet    Refill:  1    CMA served as scribe during this visit. History, Physical and Plan performed by medical provider. Documentation and orders reviewed and attested to.  Danise Edge, MD

## 2017-03-09 NOTE — Patient Instructions (Addendum)
MIND diet  Preventive Care 18-39 Years, Male Preventive care refers to lifestyle choices and visits with your health care provider that can promote health and wellness. What does preventive care include?  A yearly physical exam. This is also called an annual well check.  Dental exams once or twice a year.  Routine eye exams. Ask your health care provider how often you should have your eyes checked.  Personal lifestyle choices, including: ? Daily care of your teeth and gums. ? Regular physical activity. ? Eating a healthy diet. ? Avoiding tobacco and drug use. ? Limiting alcohol use. ? Practicing safe sex. What happens during an annual well check? The services and screenings done by your health care provider during your annual well check will depend on your age, overall health, lifestyle risk factors, and family history of disease. Counseling Your health care provider may ask you questions about your:  Alcohol use.  Tobacco use.  Drug use.  Emotional well-being.  Home and relationship well-being.  Sexual activity.  Eating habits.  Work and work Statistician.  Screening You may have the following tests or measurements:  Height, weight, and BMI.  Blood pressure.  Lipid and cholesterol levels. These may be checked every 5 years starting at age 49.  Diabetes screening. This is done by checking your blood sugar (glucose) after you have not eaten for a while (fasting).  Skin check.  Hepatitis C blood test.  Hepatitis B blood test.  Sexually transmitted disease (STD) testing.  Discuss your test results, treatment options, and if necessary, the need for more tests with your health care provider. Vaccines Your health care provider may recommend certain vaccines, such as:  Influenza vaccine. This is recommended every year.  Tetanus, diphtheria, and acellular pertussis (Tdap, Td) vaccine. You may need a Td booster every 10 years.  Varicella vaccine. You may need  this if you have not been vaccinated.  HPV vaccine. If you are 44 or younger, you may need three doses over 6 months.  Measles, mumps, and rubella (MMR) vaccine. You may need at least one dose of MMR.You may also need a second dose.  Pneumococcal 13-valent conjugate (PCV13) vaccine. You may need this if you have certain conditions and have not been vaccinated.  Pneumococcal polysaccharide (PPSV23) vaccine. You may need one or two doses if you smoke cigarettes or if you have certain conditions.  Meningococcal vaccine. One dose is recommended if you are age 32-21 years and a first-year college student living in a residence hall, or if you have one of several medical conditions. You may also need additional booster doses.  Hepatitis A vaccine. You may need this if you have certain conditions or if you travel or work in places where you may be exposed to hepatitis A.  Hepatitis B vaccine. You may need this if you have certain conditions or if you travel or work in places where you may be exposed to hepatitis B.  Haemophilus influenzae type b (Hib) vaccine. You may need this if you have certain risk factors.  Talk to your health care provider about which screenings and vaccines you need and how often you need them. This information is not intended to replace advice given to you by your health care provider. Make sure you discuss any questions you have with your health care provider. Document Released: 11/22/2001 Document Revised: 06/15/2016 Document Reviewed: 07/28/2015 Elsevier Interactive Patient Education  2017 [employer].

## 2017-03-09 NOTE — Assessment & Plan Note (Signed)
Patient encouraged to maintain heart healthy diet, regular exercise, adequate sleep. Consider daily probiotics. Take medications as prescribed 

## 2017-03-09 NOTE — Progress Notes (Signed)
Pre visit review using our clinic review tool, if applicable. No additional management support is needed unless otherwise documented below in the visit note. 

## 2017-03-10 LAB — LDL CHOLESTEROL, DIRECT: LDL DIRECT: 88 mg/dL

## 2017-03-10 LAB — COMPREHENSIVE METABOLIC PANEL
ALK PHOS: 96 U/L (ref 39–117)
ALT: 25 U/L (ref 0–53)
AST: 23 U/L (ref 0–37)
Albumin: 4.5 g/dL (ref 3.5–5.2)
BILIRUBIN TOTAL: 0.6 mg/dL (ref 0.2–1.2)
BUN: 18 mg/dL (ref 6–23)
CALCIUM: 9.4 mg/dL (ref 8.4–10.5)
CO2: 30 mEq/L (ref 19–32)
CREATININE: 1.11 mg/dL (ref 0.40–1.50)
Chloride: 106 mEq/L (ref 96–112)
GFR: 80.7 mL/min (ref >60.00)
GLUCOSE: 79 mg/dL (ref 70–99)
Potassium: 4 mEq/L (ref 3.5–5.1)
Sodium: 141 mEq/L (ref 135–145)
TOTAL PROTEIN: 7 g/dL (ref 6.0–8.3)

## 2017-03-10 LAB — LIPID PANEL
CHOLESTEROL: 160 mg/dL (ref 0–200)
HDL: 34.5 mg/dL — ABNORMAL LOW (ref >39.00)
NonHDL: 125.34
Total CHOL/HDL Ratio: 5
VLDL: 80 mg/dL — ABNORMAL HIGH (ref 0.0–40.0)

## 2017-03-10 LAB — TSH: TSH: 1.52 u[IU]/mL (ref 0.35–4.50)

## 2017-03-12 ENCOUNTER — Encounter: Payer: Self-pay | Admitting: Family Medicine

## 2017-03-12 DIAGNOSIS — E785 Hyperlipidemia, unspecified: Secondary | ICD-10-CM | POA: Insufficient documentation

## 2017-03-12 HISTORY — DX: Hyperlipidemia, unspecified: E78.5

## 2017-03-12 NOTE — Assessment & Plan Note (Signed)
Encouraged heart healthy diet, increase exercise, avoid trans fats, consider a krill oil cap daily 

## 2017-03-12 NOTE — Assessment & Plan Note (Addendum)
Avoid offending foods, start probiotics. Do not eat large meals in late evening and consider raising head of bed. Omeprazole 40 mg daily

## 2017-03-12 NOTE — Assessment & Plan Note (Signed)
Encouraged moist heat and gentle stretching as tolerated. May try NSAIDs and prescription meds as directed and report if symptoms worsen or seek immediate care 

## 2017-03-12 NOTE — Assessment & Plan Note (Signed)
Good response to Sertraline and occasional Lorazepam can be continued.

## 2017-03-13 ENCOUNTER — Other Ambulatory Visit: Payer: Self-pay | Admitting: Family Medicine

## 2017-03-13 DIAGNOSIS — E785 Hyperlipidemia, unspecified: Secondary | ICD-10-CM

## 2017-03-24 ENCOUNTER — Encounter: Payer: Self-pay | Admitting: Family Medicine

## 2017-06-13 ENCOUNTER — Other Ambulatory Visit: Payer: BLUE CROSS/BLUE SHIELD

## 2017-08-27 IMAGING — US US ABDOMEN COMPLETE
1 series · 13 of 25 positions shown · non-contrast
Comparison: 03/05/2016 right upper quadrant abdominal ultrasound

CLINICAL DATA: Right upper quadrant pain mostly after meals x6
months. Bloating in the mid abdomen.

EXAM:
ABDOMEN ULTRASOUND COMPLETE

[Series 1: us abdomen complete · 0.21mm/px · 13 of 83 slices shown]
[im 1/83]
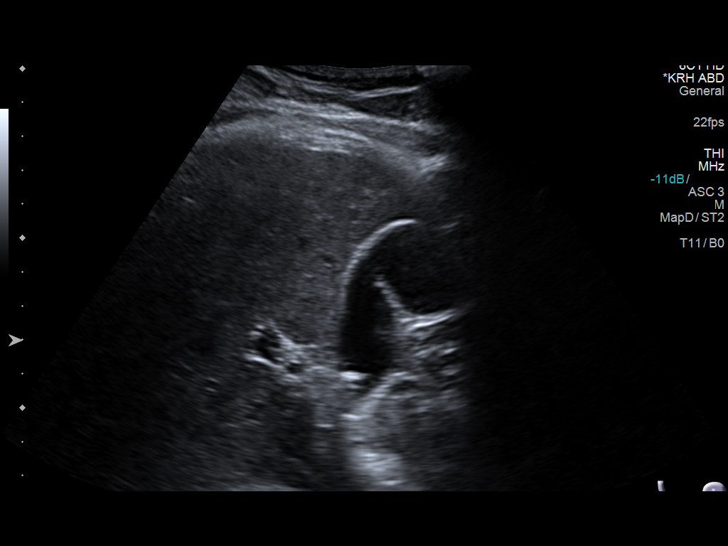
[im 7/83]
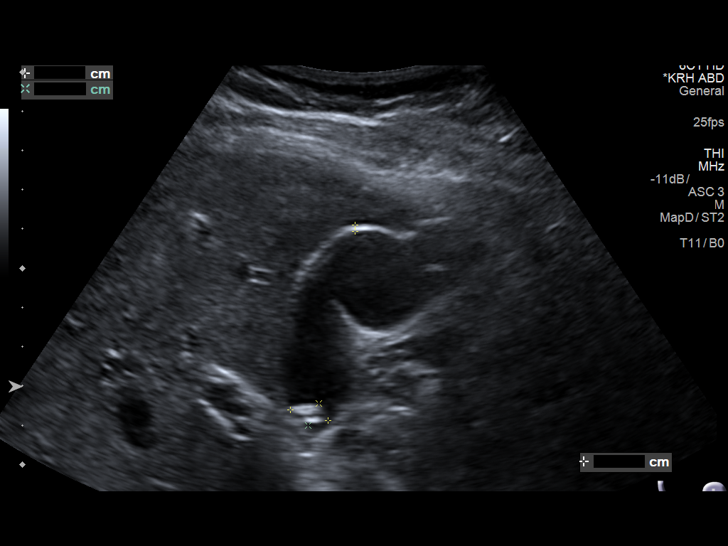
[im 14/83]
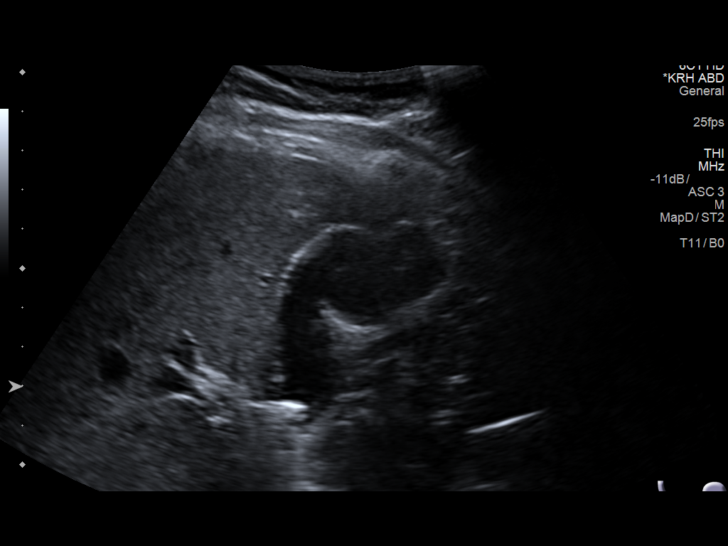
[im 21/83]
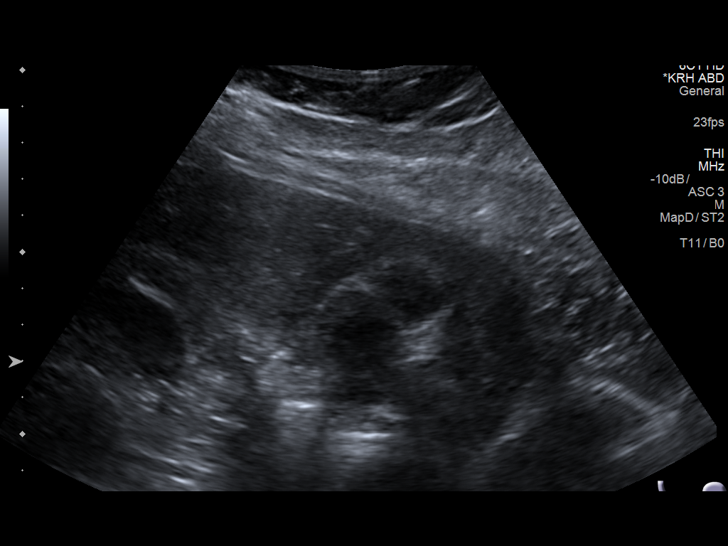
[im 28/83]
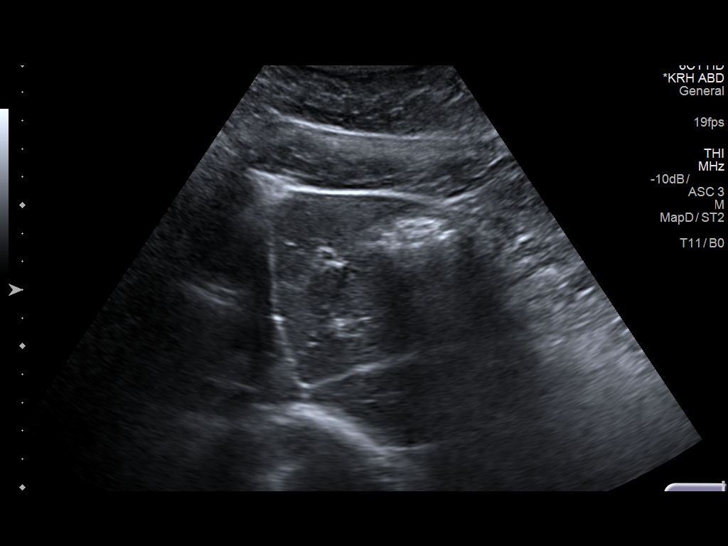
[im 35/83]
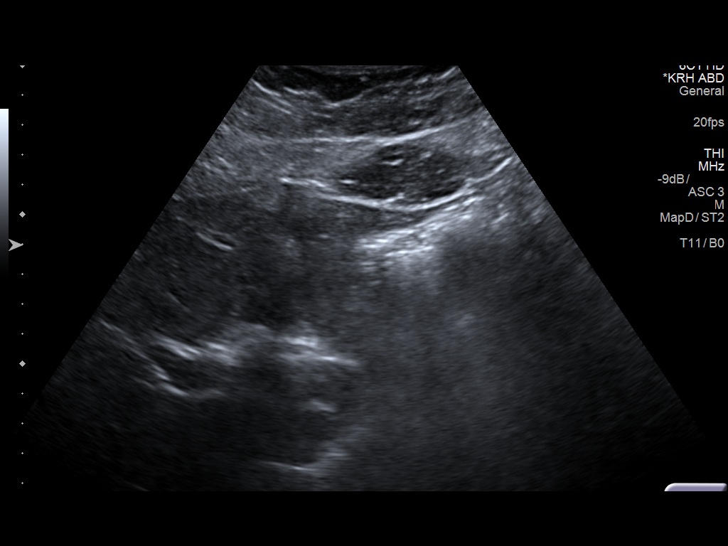
[im 42/83]
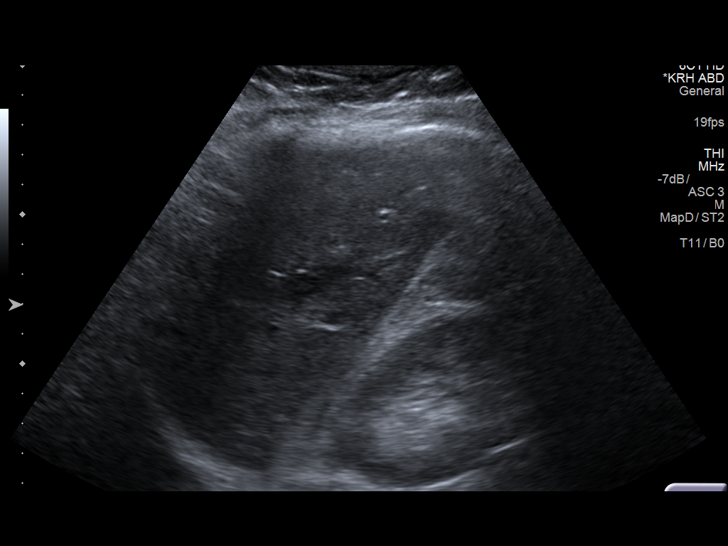
[im 48/83]
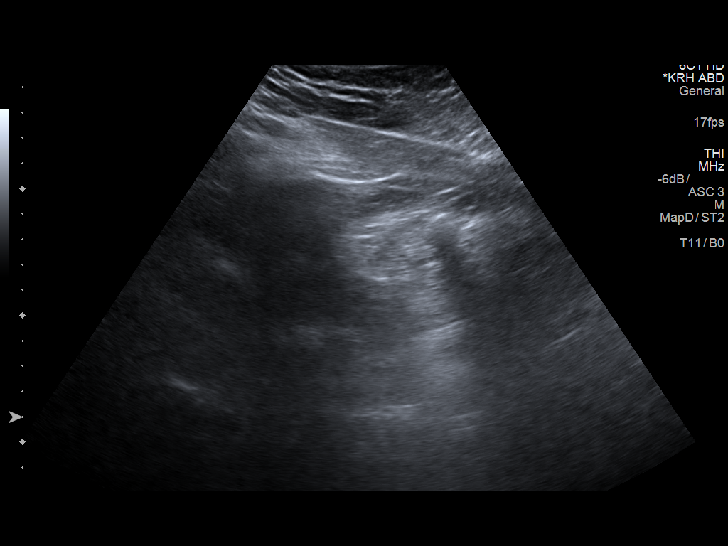
[im 55/83]
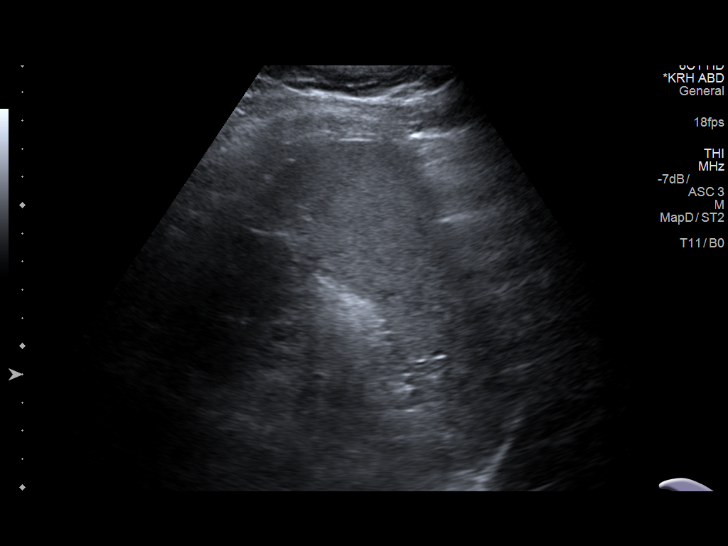
[im 62/83]
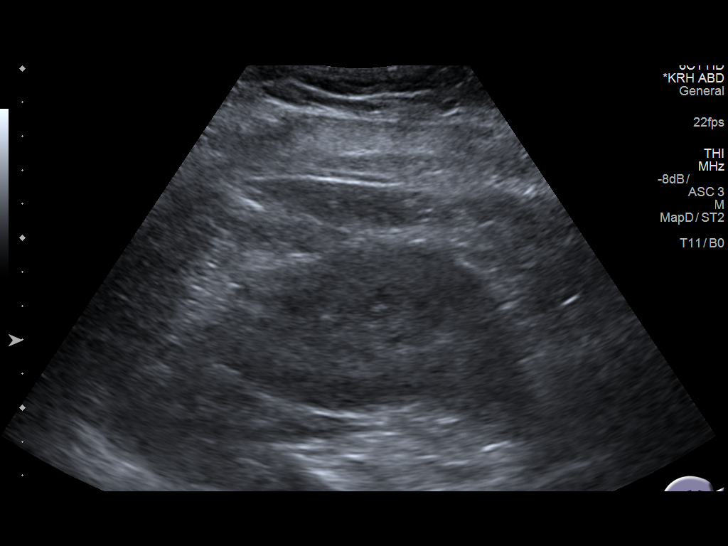
[im 69/83]
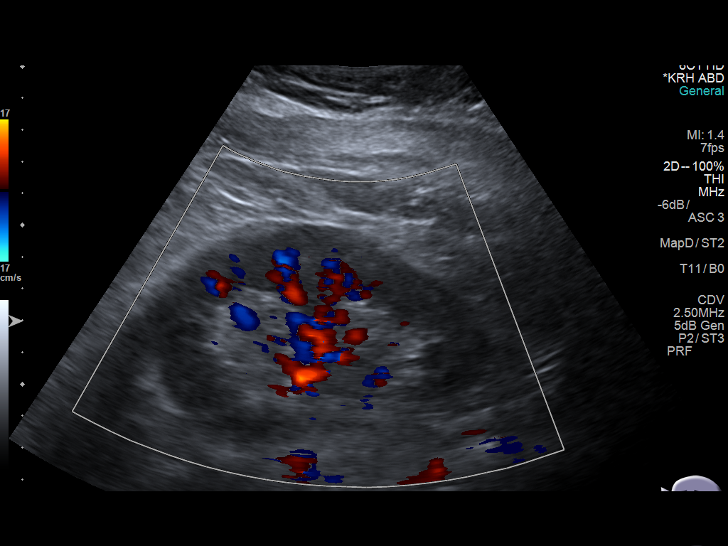
[im 76/83]
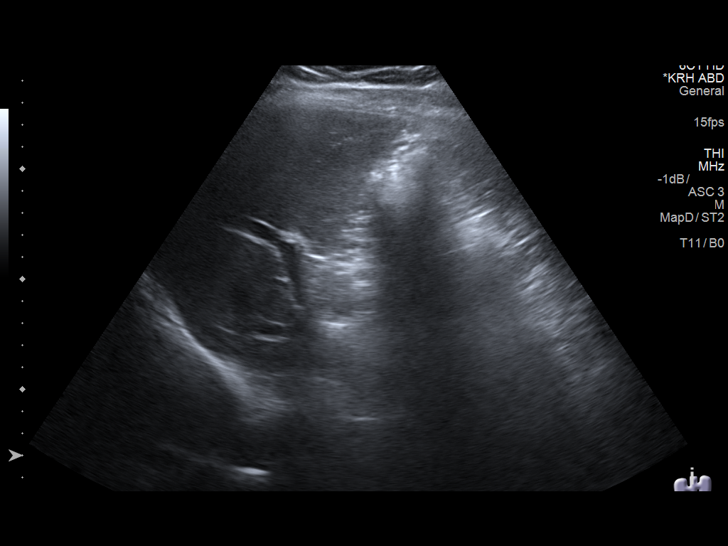
[im 83/83]
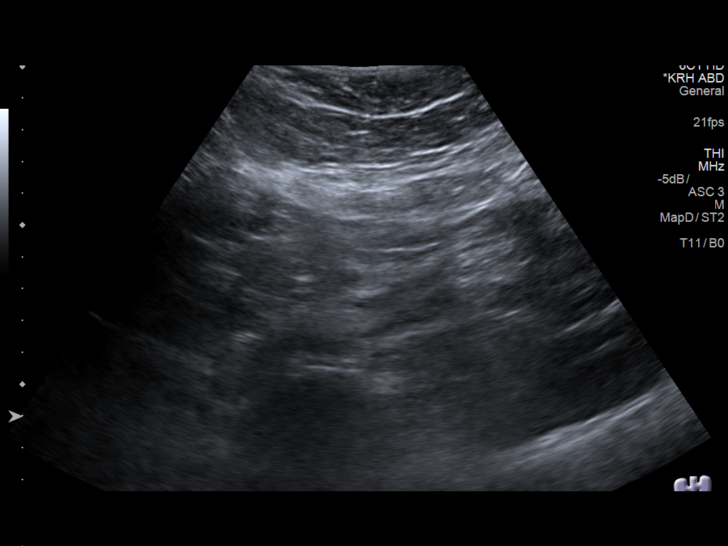

[13 of 25 positions shown; findings below may reference images not displayed]

FINDINGS: Gallbladder: The gallbladder is not distended and folded upon itself
as before. There are 2 calculi seen in the neck of the gallbladder
measuring 10 and 6 mm respectively. No pericholecystic fluid is
identified. There was no sonographic Murphy sign indicated by the
technologist.

Common bile duct: Diameter: Normal at 2 mm

Liver: No space-occupying mass of the liver. There is mild increase
in echogenicity which may reflect fatty infiltration.

IVC: Suboptimally visualized due to overlying bowel.

Pancreas: Suboptimally visualized due to overlying bowel.

Spleen: 9.2 cm in sagittal span without space-occupying mass. The
spleen is homogeneous in appearance.

Right Kidney: Length: 9.9 cm. Echogenicity within normal limits. No
mass or hydronephrosis visualized.

Left Kidney: Length: 10.5 cm. Echogenicity within normal limits. No
mass or hydronephrosis visualized.

Abdominal aorta: The aorta was not aneurysmal with the largest AP
dimension approximately 2.6 cm.

Other findings: None.
IMPRESSION: Uncomplicated cholelithiasis.  Mild fatty infiltration of the liver.

## 2017-09-03 ENCOUNTER — Other Ambulatory Visit: Payer: Self-pay | Admitting: Family Medicine

## 2017-10-04 ENCOUNTER — Other Ambulatory Visit: Payer: Self-pay | Admitting: Family Medicine

## 2018-03-27 ENCOUNTER — Ambulatory Visit (INDEPENDENT_AMBULATORY_CARE_PROVIDER_SITE_OTHER): Payer: 59 | Admitting: Family Medicine

## 2018-03-27 ENCOUNTER — Encounter: Payer: Self-pay | Admitting: Family Medicine

## 2018-03-27 VITALS — BP 112/80 | HR 81 | Temp 97.6°F | Resp 18 | Ht 71.0 in | Wt 206.4 lb

## 2018-03-27 DIAGNOSIS — Z0000 Encounter for general adult medical examination without abnormal findings: Secondary | ICD-10-CM

## 2018-03-27 DIAGNOSIS — E782 Mixed hyperlipidemia: Secondary | ICD-10-CM | POA: Diagnosis not present

## 2018-03-27 DIAGNOSIS — D582 Other hemoglobinopathies: Secondary | ICD-10-CM | POA: Diagnosis not present

## 2018-03-27 DIAGNOSIS — R12 Heartburn: Secondary | ICD-10-CM | POA: Diagnosis not present

## 2018-03-27 DIAGNOSIS — J302 Other seasonal allergic rhinitis: Secondary | ICD-10-CM

## 2018-03-27 LAB — CBC WITH DIFFERENTIAL/PLATELET
BASOS ABS: 0 10*3/uL (ref 0.0–0.1)
Basophils Relative: 0.5 % (ref 0.0–3.0)
EOS PCT: 2.3 % (ref 0.0–5.0)
Eosinophils Absolute: 0.2 10*3/uL (ref 0.0–0.7)
HEMATOCRIT: 46.4 % (ref 39.0–52.0)
HEMOGLOBIN: 16.4 g/dL (ref 13.0–17.0)
LYMPHS PCT: 28.1 % (ref 12.0–46.0)
Lymphs Abs: 2.2 10*3/uL (ref 0.7–4.0)
MCHC: 35.4 g/dL (ref 30.0–36.0)
MCV: 85 fl (ref 78.0–100.0)
MONOS PCT: 7.4 % (ref 3.0–12.0)
Monocytes Absolute: 0.6 10*3/uL (ref 0.1–1.0)
NEUTROS PCT: 61.7 % (ref 43.0–77.0)
Neutro Abs: 4.9 10*3/uL (ref 1.4–7.7)
Platelets: 286 10*3/uL (ref 150.0–400.0)
RBC: 5.46 Mil/uL (ref 4.22–5.81)
RDW: 13.5 % (ref 11.5–15.5)
WBC: 7.9 10*3/uL (ref 4.0–10.5)

## 2018-03-27 LAB — COMPREHENSIVE METABOLIC PANEL
ALBUMIN: 4.5 g/dL (ref 3.5–5.2)
ALK PHOS: 116 U/L (ref 39–117)
ALT: 27 U/L (ref 0–53)
AST: 19 U/L (ref 0–37)
BILIRUBIN TOTAL: 0.6 mg/dL (ref 0.2–1.2)
BUN: 15 mg/dL (ref 6–23)
CALCIUM: 9.5 mg/dL (ref 8.4–10.5)
CO2: 29 mEq/L (ref 19–32)
Chloride: 105 mEq/L (ref 96–112)
Creatinine, Ser: 0.94 mg/dL (ref 0.40–1.50)
GFR: 97.16 mL/min (ref >60.00)
GLUCOSE: 99 mg/dL (ref 70–99)
Potassium: 3.7 mEq/L (ref 3.5–5.1)
Sodium: 142 mEq/L (ref 135–145)
TOTAL PROTEIN: 7 g/dL (ref 6.0–8.3)

## 2018-03-27 LAB — LIPID PANEL
CHOLESTEROL: 171 mg/dL (ref 0–200)
HDL: 36.2 mg/dL — AB (ref >39.00)
LDL Cholesterol: 101 mg/dL — ABNORMAL HIGH (ref 0–99)
NONHDL: 134.88
Total CHOL/HDL Ratio: 5
Triglycerides: 169 mg/dL — ABNORMAL HIGH (ref 0.0–149.0)
VLDL: 33.8 mg/dL (ref 0.0–40.0)

## 2018-03-27 NOTE — Assessment & Plan Note (Signed)
Encouraged heart healthy diet, increase exercise, avoid trans fats, consider a krill oil cap daily 

## 2018-03-27 NOTE — Assessment & Plan Note (Signed)
Uses Flonase prn with good results

## 2018-03-27 NOTE — Assessment & Plan Note (Addendum)
Patient encouraged to maintain heart healthy diet, regular exercise, adequate sleep. Consider daily probiotics. Take medications as prescribed 

## 2018-03-27 NOTE — Patient Instructions (Addendum)
DASH or MIND diet   Call for nurse appointment for the Tetanus (Tdap)  Preventive Care 18-39 Years, Male Preventive care refers to lifestyle choices and visits with your health care provider that can promote health and wellness. What does preventive care include?  A yearly physical exam. This is also called an annual well check.  Dental exams once or twice a year.  Routine eye exams. Ask your health care provider how often you should have your eyes checked.  Personal lifestyle choices, including: ? Daily care of your teeth and gums. ? Regular physical activity. ? Eating a healthy diet. ? Avoiding tobacco and drug use. ? Limiting alcohol use. ? Practicing safe sex. What happens during an annual well check? The services and screenings done by your health care provider during your annual well check will depend on your age, overall health, lifestyle risk factors, and family history of disease. Counseling Your health care provider may ask you questions about your:  Alcohol use.  Tobacco use.  Drug use.  Emotional well-being.  Home and relationship well-being.  Sexual activity.  Eating habits.  Work and work Statistician.  Screening You may have the following tests or measurements:  Height, weight, and BMI.  Blood pressure.  Lipid and cholesterol levels. These may be checked every 5 years starting at age 44.  Diabetes screening. This is done by checking your blood sugar (glucose) after you have not eaten for a while (fasting).  Skin check.  Hepatitis C blood test.  Hepatitis B blood test.  Sexually transmitted disease (STD) testing.  Discuss your test results, treatment options, and if necessary, the need for more tests with your health care provider. Vaccines Your health care provider may recommend certain vaccines, such as:  Influenza vaccine. This is recommended every year.  Tetanus, diphtheria, and acellular pertussis (Tdap, Td) vaccine. You may need a  Td booster every 10 years.  Varicella vaccine. You may need this if you have not been vaccinated.  HPV vaccine. If you are 33 or younger, you may need three doses over 6 months.  Measles, mumps, and rubella (MMR) vaccine. You may need at least one dose of MMR.You may also need a second dose.  Pneumococcal 13-valent conjugate (PCV13) vaccine. You may need this if you have certain conditions and have not been vaccinated.  Pneumococcal polysaccharide (PPSV23) vaccine. You may need one or two doses if you smoke cigarettes or if you have certain conditions.  Meningococcal vaccine. One dose is recommended if you are age 78-21 years and a first-year college student living in a residence hall, or if you have one of several medical conditions. You may also need additional booster doses.  Hepatitis A vaccine. You may need this if you have certain conditions or if you travel or work in places where you may be exposed to hepatitis A.  Hepatitis B vaccine. You may need this if you have certain conditions or if you travel or work in places where you may be exposed to hepatitis B.  Haemophilus influenzae type b (Hib) vaccine. You may need this if you have certain risk factors.  Talk to your health care provider about which screenings and vaccines you need and how often you need them. This information is not intended to replace advice given to you by your health care provider. Make sure you discuss any questions you have with your health care provider. Document Released: 11/22/2001 Document Revised: 06/15/2016 Document Reviewed: 07/28/2015 Elsevier Interactive Patient Education  [employer].

## 2018-03-27 NOTE — Progress Notes (Signed)
Subjective:  I acted as a Neurosurgeonscribe for Dr. Abner GreenspanBlyth. Scott Molina, [place]  Patient ID: Scott PenceMatthew S Molina, male    DOB: 1981-02-27, 37 y.o.   MRN: [id number]  No chief complaint on file.   HPI  Patient is in today for an annual exam. He is following up on his anxiety and other medical concerns. Patient has no acute concerns. No recent febrile illness or acute hospitalizations. Denies CP/palp/SOB/HA/congestion/fevers/GI or GU c/o. Taking meds as prescribed. Manages activities of daily living well. Is not exercising regularly. Tries to eat well most days.    Patient Care Team: Bradd CanaryBlyth, Maili Shutters A, MD as PCP - General (Family Medicine)   Past Medical History:  Diagnosis Date  . Allergy   . [facility]  . Anxiety 02/12/2016  . Decreased visual acuity 10/29/2015  . GERD (gastroesophageal reflux disease)   . Heartburn 10/29/2015  . Hemorrhoid 10/29/2015  . History of MRSA infection   . Hyperlipidemia 03/12/2017  . Left shoulder pain 02/12/2016  . Left varicocele 11/08/2015  . Low back pain 11/08/2015  . Nausea with vomiting 10/29/2015  . Preventative health care 10/29/2015  . Seasonal allergies 10/29/2015    Past Surgical History:  Procedure Laterality Date  . TONSILLECTOMY      Family History  Problem Relation Age of Onset  . Diabetes Mother   . Diabetes Father   . Hepatitis C Father   . Arthritis Father        back pain  . Arthritis Sister        knees and back  . Arthritis Brother   . Arthritis Maternal Uncle        wear and tear  . Cancer Paternal Grandmother        ? form  . Testicular cancer Unknown   . Colon cancer Neg Hx   . Prostate cancer Neg Hx     Social History   Socioeconomic History  . Marital status: Single    Spouse name: Not on file  . Number of children: 0  . Years of education: Not on file  . Highest education level: Not on file  Occupational History  . Occupation: 2 jobs  Engineer, productionocial Needs  . Financial resource strain: Not on file  . Food insecurity:     Worry: Not on file    Inability: Not on file  . Transportation needs:    Medical: Not on file    Non-medical: Not on file  Tobacco Use  . Smoking status: Former Smoker    Types: Cigars, Pipe  . Smokeless tobacco: Never Used  . Tobacco comment: CIGARS  Substance and Sexual Activity  . Alcohol use: Yes    Alcohol/week: 0.0 oz  . Drug use: No  . 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 Physical activity:    Days per week: Not on file    Minutes per session: Not on file  . Stress: Not on file  Relationships  . Social connections:    Talks on phone: Not on file    Gets together: Not on file    Attends religious service: Not on file    Active member of club or organization: Not on file    Attends meetings of clubs or organizations: Not on file    Relationship status: Not on file  . Intimate partner violence:  Fear of current or ex partner: Not on file    Emotionally abused: Not on file    Physically abused: Not on file    Forced sexual activity: Not on file  Other Topics Concern  . Not on file  Social History Narrative   Live w/ family    Outpatient Medications Prior to Visit  Medication Sig Dispense Refill  . cyclobenzaprine (FLEXERIL) 5 MG tablet Take 1 tablet (5 mg total) by mouth at bedtime. 30 tablet 1  . fluticasone (FLONASE) 50 MCG/ACT nasal spray Place into both nostrils daily.    Marland Kitchen LORazepam (ATIVAN) 0.5 MG tablet Take 1 tablet (0.5 mg total) by mouth every 8 (eight) hours as needed for anxiety. 30 tablet 01  . omeprazole (PRILOSEC) 40 MG capsule Take 1 capsule (40 mg total) by mouth daily. 30 capsule 5  . sertraline (ZOLOFT) 25 MG tablet Take 1 tablet (25 mg total) by mouth daily. 30 tablet 0   No facility-administered medications prior to visit.     Allergies  Allergen Reactions  . Aspartame And Phenylalanine   . Aspergum [Aspirin]     This is  in gum    Review of Systems  Constitutional: Negative for chills, fever and malaise/fatigue.  HENT: Positive for congestion. Negative for hearing loss.   Eyes: Negative for discharge.  Respiratory: Negative for cough, sputum production and shortness of breath.   Cardiovascular: Negative for chest pain, palpitations and leg swelling.  Gastrointestinal: Positive for heartburn. Negative for abdominal pain, blood in stool, constipation, diarrhea, nausea and vomiting.  Genitourinary: Negative for dysuria, frequency, hematuria and urgency.  Musculoskeletal: Negative for back pain, falls and myalgias.  Skin: Negative for rash.  Neurological: Negative for dizziness, sensory change, loss of consciousness, weakness and headaches.  Endo/Heme/Allergies: Negative for environmental allergies. Does not bruise/bleed easily.  Psychiatric/Behavioral: Negative for depression and suicidal ideas. The patient is not nervous/anxious and does not have insomnia.        Objective:    Physical Exam  Constitutional: He is oriented to person, place, and time. No distress.  HENT:  Head: Normocephalic and atraumatic.  Eyes: Conjunctivae are normal.  Neck: Neck supple. No thyromegaly present.  Cardiovascular: Normal rate, regular rhythm and normal heart sounds.  No murmur heard. Pulmonary/Chest: Effort normal and breath sounds normal. No respiratory distress.  Abdominal: He exhibits no distension and no mass. There is no tenderness.  Musculoskeletal: He exhibits no edema.  Neurological: He is alert and oriented to person, place, and time.  Skin: Skin is warm.  Psychiatric: Judgment normal.    There were no vitals taken for this visit. Wt Readings from Last 3 Encounters:  03/09/17 207 lb 6.4 oz (94.1 kg)  08/24/16 193 lb 12.8 oz (87.9 kg)  02/12/16 186 lb 2 oz (84.4 kg)   BP Readings from Last 3 Encounters:  03/09/17 117/72  08/24/16 111/81  02/12/16 120/80      There is no immunization history on  file for this patient.  Health Maintenance  Topic Date Due  . TETANUS/TDAP  05/27/2002  . INFLUENZA VACCINE  05/10/2018  . HIV Screening  Addressed    Lab Results  Component Value Date   WBC 6.4 08/24/2016   HGB 17.7 (H) 08/24/2016   HCT 50.9 08/24/2016   PLT 290.0 08/24/2016   GLUCOSE 79 03/09/2017   CHOL 160 03/09/2017   TRIG (H) 03/09/2017    400.0 Triglyceride is over 400; calculations on Lipids are invalid.   HDL 34.50 (L)  03/09/2017   LDLDIRECT 88.0 03/09/2017   ALT 25 03/09/2017   AST 23 03/09/2017   NA 141 03/09/2017   K 4.0 03/09/2017   CL 106 03/09/2017   CREATININE 1.11 03/09/2017   BUN 18 03/09/2017   CO2 30 03/09/2017   TSH 1.52 03/09/2017    Lab Results  Component Value Date   TSH 1.52 03/09/2017   Lab Results  Component Value Date   WBC 6.4 08/24/2016   HGB 17.7 (H) 08/24/2016   HCT 50.9 08/24/2016   MCV 86.9 08/24/2016   PLT 290.0 08/24/2016   Lab Results  Component Value Date   NA 141 03/09/2017   K 4.0 03/09/2017   CO2 30 03/09/2017   GLUCOSE 79 03/09/2017   BUN 18 03/09/2017   CREATININE 1.11 03/09/2017   BILITOT 0.6 03/09/2017   ALKPHOS 96 03/09/2017   AST 23 03/09/2017   ALT 25 03/09/2017   PROT 7.0 03/09/2017   ALBUMIN 4.5 03/09/2017   CALCIUM 9.4 03/09/2017   GFR 80.70 03/09/2017   Lab Results  Component Value Date   CHOL 160 03/09/2017   Lab Results  Component Value Date   HDL 34.50 (L) 03/09/2017   No results found for: [facility] Lab Results  Component Value Date   TRIG (H) 03/09/2017    400.0 Triglyceride is over 400; calculations on Lipids are invalid.   Lab Results  Component Value Date   CHOLHDL 5 03/09/2017   No results found for: HGBA1C       Assessment & Plan:   Problem List Items Addressed This Visit    None      I am having Scott Molina maintain his sertraline, fluticasone, LORazepam, cyclobenzaprine, and omeprazole.  No orders of the defined types were placed in this  encounter.    Crissie Sickles, [place]

## 2018-03-27 NOTE — Assessment & Plan Note (Signed)
Still has trouble a couple times a week on Omeprazole. Recommend smaller meals, weight loss decrease fatty and spicy and report if persists for referral to GI

## 2018-04-13 ENCOUNTER — Other Ambulatory Visit: Payer: Self-pay | Admitting: Family Medicine

## 2018-04-18 ENCOUNTER — Telehealth: Payer: Self-pay | Admitting: Family Medicine

## 2018-04-18 NOTE — Telephone Encounter (Signed)
Patient needs his omeprazole (PRILOSEC) 40 MG capsule refilled

## 2018-04-18 NOTE — Telephone Encounter (Signed)
Copied from CRM [id number]. Topic: Quick Communication - Rx Refill/Question >> Apr 18, 2018 12:56 PM Stephannie LiSimmons, Dene Landsberg L, NT wrote: Medication: [id number] [place] - [address] [facility] & [address] [phone] (Phone) [phone] (Fax)      Has the patient contacted their pharmacy? yes  (Agent: If no, request that the patient contact the pharmacy for the refill. (Agent: If yes, when and what did the pharmacy advise?  Preferred Pharmacy (with phone number or street name):[facility] [id number] - [place], [place] - [address] AT [facility] [address] [phone] (Phone) [phone] (Fax)  Pharmacy told the patient  did receive the first request ,    Agent: Please be advised that RX refills may take up to 3 business days. We ask that you follow-up with your pharmacy.

## 2018-04-18 NOTE — Telephone Encounter (Signed)
Left message for pt. To call back - need to know which medication needs to be refilled.

## 2018-04-18 NOTE — Telephone Encounter (Signed)
Attempted to reach patient, no VM set up, no dial tone. Pharmacy states per insurance, [facility] or mail order 90 days.

## 2018-05-18 ENCOUNTER — Other Ambulatory Visit: Payer: Self-pay | Admitting: Family Medicine

## 2018-05-21 ENCOUNTER — Telehealth: Payer: Self-pay | Admitting: Family Medicine

## 2018-05-21 NOTE — Telephone Encounter (Signed)
Copied from CRM [phone]#[id number]. Topic: Quick Communication - Rx Refill/Question >> May 21, 2018  2:13 PM Angela NevinWilliams, Candice N wrote: Medication: omeprazole (PRILOSEC) 40 MG capsule   Pt called stating that he needs his omeprazole (PRILOSEC) 40 MG capsule refilled. Pt has already contacted the pharmacy. Pt states that the bottle says there are 0 refills left. Pt wants to know if he can get this filled without coming back in. Pt states that he is on his last pill.   Preferred Pharmacy (with phone number or street name[id number] - [place], [place] - [address] [facility] & [address] [phone] (Phone) [phone])    Agent: Please be advised that RX refills may take up to 3 business days. We ask that you follow-up with your pharmacy.

## 2018-05-21 NOTE — Telephone Encounter (Signed)
Omeprazole already refilled 

## 2018-08-31 ENCOUNTER — Ambulatory Visit: Payer: 59 | Admitting: Internal Medicine

## 2018-08-31 ENCOUNTER — Encounter: Payer: Self-pay | Admitting: Internal Medicine

## 2018-08-31 VITALS — BP 118/84 | HR 88 | Temp 98.3°F | Resp 16 | Ht 71.0 in | Wt 211.0 lb

## 2018-08-31 DIAGNOSIS — M542 Cervicalgia: Secondary | ICD-10-CM | POA: Diagnosis not present

## 2018-08-31 MED ORDER — PREDNISONE 10 MG PO TABS: ORAL_TABLET | ORAL | 0 refills | Status: DC

## 2018-08-31 NOTE — Progress Notes (Signed)
Subjective:    Patient ID: Scott Molina, male    DOB: 07/01/1981, 37 y.o.   MRN: [id number]  DOS:  08/31/2018 Type of visit - description : acute Sx description is vague.  States that he woke up with bilateral posterior neck pain "a month or two ago". He was hard to turn his head left or right.  He took muscle relaxant, it make it easy to turn his head but the pain persisted, mild. Has noted that the muscles of his upper torso and neck "tight up" when he does heavy lifting at work. About a week ago developed also a ache, very mild, at the posterior area of the head bilaterally. Usually at the same time  he feels the muscles of his upper torso very tight. He googled all his symptoms as he is concerned about they represent something serious   Review of Systems Denies fever chills no nausea or vomiting. No paresthesias. Pain is not the "worst headache of his life".  Past Medical History:  Diagnosis Date  . Allergy   . Anxiety    At [employer]  . Anxiety 02/12/2016  . Decreased visual acuity 10/29/2015  . GERD (gastroesophageal reflux disease)   . Heartburn 10/29/2015  . Hemorrhoid 10/29/2015  . History of MRSA infection   . Hyperlipidemia 03/12/2017  . Left shoulder pain 02/12/2016  . Left varicocele 11/08/2015  . Low back pain 11/08/2015  . Nausea with vomiting 10/29/2015  . Preventative health care 10/29/2015  . Seasonal allergies 10/29/2015    Past Surgical History:  Procedure Laterality Date  . TONSILLECTOMY      Social History   Socioeconomic History  . Marital status: Single    Spouse name: Not on file  . Number of children: 0  . Years of education: Not on file  . Highest education level: Not on file  Occupational History  . Occupation: 2 jobs  Engineer, production  . Financial resource strain: Not on file  . Food insecurity:    Worry: Not on file    Inability: Not on file  . Transportation needs:    Medical: Not on file    Non-medical: Not on file  Tobacco Use  .  Smoking status: Former Smoker    Types: Cigars, Pipe  . Smokeless tobacco: Never Used  . Tobacco comment: CIGARS  Substance and Sexual Activity  . Alcohol use: Yes    Alcohol/week: 0.0 standard drinks  . Drug use: No  . Sexual activity: Not on file  Lifestyle  . Physical activity:    Days per week: Not on file    Minutes per session: Not on file  . Stress: Not on file  Relationships  . Social connections:    Talks on phone: Not on file    Gets together: Not on file    Attends religious service: Not on file    Active member of club or organization: Not on file    Attends meetings of clubs or organizations: Not on file    Relationship status: Not on file  . Intimate partner violence:    Fear of current or ex partner: Not on file    Emotionally abused: Not on file    Physically abused: Not on file    Forced sexual activity: Not on file  Other Topics Concern  . Not on file  Social History Narrative   Live w/ family      Allergies as of 08/31/2018  Reactions   Aspartame And Phenylalanine       Medication List        Accurate as of 08/31/18 11:59 PM. Always use your most recent med list.          cyclobenzaprine 5 MG tablet Commonly known as:  FLEXERIL Take 1 tablet (5 mg total) by mouth at bedtime.   fluticasone 50 MCG/ACT nasal spray Commonly known as:  FLONASE Place into both nostrils daily.   LORazepam 0.5 MG tablet Commonly known as:  ATIVAN Take 1 tablet (0.5 mg total) by mouth every 8 (eight) hours as needed for anxiety.   omeprazole 40 MG capsule Commonly known as:  PRILOSEC Take 1 capsule (40 mg total) by mouth daily.   predniSONE 10 MG tablet Commonly known as:  DELTASONE 4 tablets x 2 days, 3 tabs x 2 days, 2 tabs x 2 days, 1 tab x 2 days           Objective:   Physical Exam BP 118/84 (BP Location: Left Arm, Patient Position: Sitting, Cuff Size: Normal)   Pulse 88   Temp 98.3 F (36.8 C) (Oral)   Resp 16   Ht 5\' 11"  (1.803 m)    Wt 211 lb (95.7 kg)   SpO2 98%   BMI 29.43 kg/m  General:   Well developed, NAD, BMI noted. HEENT:  Normocephalic . Face symmetric, atraumatic Neck: No TTP of the cervical or thoracic spine. Neck motion: Is full but he reports pain when he turns to the extreme left or right. Neurologic:  alert & oriented X3.  Speech normal, gait appropriate for age and unassisted.  Motor and DTR symmetric. EOMI, pupils equal and reactive Psych--  Cognition and judgment appear intact.  Cooperative with normal attention span and concentration.  Behavior appropriate. No anxious or depressed appearing.      Assessment & Plan:    37 year old gentleman, PMH includes allergies, anxiety, left varicocele, presents with Neck pain: As described above, now also associated with a mild "headache" located at the posterior area of the head. He reports that his upper torso, neck and head get tight when he does some lifting at work. Lifts up to 80 pounds @ work. Symptoms are likely MSK related, I doubt he has a serious intracranial condition. We agreed on: Prednisone, Tylenol, [facility].  I would like him to learn good stretching techniques of his upper torso and practice them before he goes to work.  He also needs better posture when he does heavy lifting at work. He is to call if he is not better in 3 to 4 weeks and seek medical attention if he has any unusual or severe headache.

## 2018-08-31 NOTE — Patient Instructions (Signed)
Take prednisone as prescribed  Tylenol  500 mg OTC 2 tabs a day every 8 hours as needed for pain  If you don't hear from PT in 1 week, please let us know  Call if no gradually better in 3-4 week  ER or call if severe or unusually headache

## 2018-08-31 NOTE — Progress Notes (Signed)
Pre visit review using our clinic review tool, if applicable. No additional management support is needed unless otherwise documented below in the visit note. 

## 2018-09-26 ENCOUNTER — Ambulatory Visit: Payer: 59 | Admitting: Physical Therapy

## 2019-05-05 ENCOUNTER — Other Ambulatory Visit: Payer: Self-pay | Admitting: Family Medicine

## 2019-11-04 ENCOUNTER — Other Ambulatory Visit: Payer: Self-pay

## 2019-11-04 ENCOUNTER — Ambulatory Visit: Payer: 59 | Admitting: Family Medicine

## 2019-11-04 ENCOUNTER — Encounter: Payer: Self-pay | Admitting: Family Medicine

## 2019-11-04 VITALS — BP 120/80 | HR 99 | Temp 98.1°F | Resp 18 | Wt 179.4 lb

## 2019-11-04 DIAGNOSIS — R1013 Epigastric pain: Secondary | ICD-10-CM

## 2019-11-04 DIAGNOSIS — R1011 Right upper quadrant pain: Secondary | ICD-10-CM | POA: Insufficient documentation

## 2019-11-04 DIAGNOSIS — K649 Unspecified hemorrhoids: Secondary | ICD-10-CM

## 2019-11-04 DIAGNOSIS — R109 Unspecified abdominal pain: Secondary | ICD-10-CM

## 2019-11-04 DIAGNOSIS — E782 Mixed hyperlipidemia: Secondary | ICD-10-CM | POA: Diagnosis not present

## 2019-11-04 DIAGNOSIS — F419 Anxiety disorder, unspecified: Secondary | ICD-10-CM

## 2019-11-04 DIAGNOSIS — M5440 Lumbago with sciatica, unspecified side: Secondary | ICD-10-CM | POA: Diagnosis not present

## 2019-11-04 LAB — COMPREHENSIVE METABOLIC PANEL
ALT: 13 U/L (ref 0–53)
AST: 15 U/L (ref 0–37)
Albumin: 4.7 g/dL (ref 3.5–5.2)
Alkaline Phosphatase: 96 U/L (ref 39–117)
BUN: 12 mg/dL (ref 6–23)
CO2: 30 mEq/L (ref 19–32)
Calcium: 9.8 mg/dL (ref 8.4–10.5)
Chloride: 104 mEq/L (ref 96–112)
Creatinine, Ser: 0.88 mg/dL (ref 0.40–1.50)
GFR: 97.75 mL/min (ref >60.00)
Glucose, Bld: 82 mg/dL (ref 70–99)
Potassium: 4 mEq/L (ref 3.5–5.1)
Sodium: 142 mEq/L (ref 135–145)
Total Bilirubin: 1 mg/dL (ref 0.2–1.2)
Total Protein: 7.1 g/dL (ref 6.0–8.3)

## 2019-11-04 LAB — LIPID PANEL
Cholesterol: 155 mg/dL (ref 0–200)
HDL: 37.9 mg/dL — ABNORMAL LOW (ref >39.00)
LDL Cholesterol: 104 mg/dL — ABNORMAL HIGH (ref 0–99)
NonHDL: 116.82
Total CHOL/HDL Ratio: 4
Triglycerides: 65 mg/dL (ref 0.0–149.0)
VLDL: 13 mg/dL (ref 0.0–40.0)

## 2019-11-04 LAB — CBC
HCT: 49.1 % (ref 39.0–52.0)
Hemoglobin: 17 g/dL (ref 13.0–17.0)
MCHC: 34.7 g/dL (ref 30.0–36.0)
MCV: 87.3 fl (ref 78.0–100.0)
Platelets: 289 10*3/uL (ref 150.0–400.0)
RBC: 5.62 Mil/uL (ref 4.22–5.81)
RDW: 13.6 % (ref 11.5–15.5)
WBC: 9.7 10*3/uL (ref 4.0–10.5)

## 2019-11-04 LAB — TSH: TSH: 1.9 u[IU]/mL (ref 0.35–4.50)

## 2019-11-04 MED ORDER — CYCLOBENZAPRINE HCL 5 MG PO TABS: 5.0000 mg | ORAL_TABLET | Freq: Every day | ORAL | 3 refills | Status: DC

## 2019-11-04 MED ORDER — LORAZEPAM 0.5 MG PO TABS: 0.5000 mg | ORAL_TABLET | Freq: Three times a day (TID) | ORAL | 1 refills | Status: DC | PRN

## 2019-11-04 MED ORDER — CYCLOBENZAPRINE HCL 5 MG PO TABS: 5.0000 mg | ORAL_TABLET | Freq: Two times a day (BID) | ORAL | 3 refills | Status: DC | PRN

## 2019-11-04 MED ORDER — HYDROCORTISONE ACETATE 25 MG RE SUPP: 25.0000 mg | Freq: Two times a day (BID) | RECTAL | 1 refills | Status: DC

## 2019-11-04 NOTE — Progress Notes (Signed)
Subjective:    Patient ID: Scott Molina, male    DOB: 1981-03-20, 39 y.o.   MRN: [id number]  No chief complaint on file.   HPI Patient is in today for evaluation of numerous concerns. Notes increasing anxiety. Had to have significant dental work last week and noted the lorazepam was not strong enough to help this time. Is having intermittent trouble with back pain. Also noting some ruq pain after eating and sometimes some epigastric and periumbilical pain. Has had trouble with hemorrhoids some in past but this week has one that is more persistent and uncomfortable. No bloody or tarry stool. No constipation or diarrhea noted. Denies CP/palp/SOB/HA/congestion/fevers or GU c/o. Taking meds as prescribed  Past Medical History:  Diagnosis Date  . Allergy   . Anxiety    At [employer]  . Anxiety 02/12/2016  . Decreased visual acuity 10/29/2015  . GERD (gastroesophageal reflux disease)   . Heartburn 10/29/2015  . Hemorrhoid 10/29/2015  . History of MRSA infection   . Hyperlipidemia 03/12/2017  . Left shoulder pain 02/12/2016  . Left varicocele 11/08/2015  . Low back pain 11/08/2015  . Nausea with vomiting 10/29/2015  . Preventative health care 10/29/2015  . Seasonal allergies 10/29/2015    Past Surgical History:  Procedure Laterality Date  . TONSILLECTOMY      Family History  Problem Relation Age of Onset  . Diabetes Mother   . Diabetes Father   . Hepatitis C Father   . Arthritis Father        back pain  . Other Father        MRSA sepsis  . Arthritis Sister        knees and back  . Arthritis Brother   . Arthritis Maternal Uncle        wear and tear  . Cancer Paternal Grandmother        ? form  . Testicular cancer Other   . Colon cancer Neg Hx   . Prostate cancer Neg Hx     Social History   Socioeconomic History  . Marital status: Single    Spouse name: Not on file  . Number of children: 0  . Years of education: Not on file  . Highest education level: Not on file    Occupational History  . Occupation: 2 jobs  Tobacco Use  . Smoking status: Former Smoker    Types: Cigars, Pipe  . Smokeless tobacco: Never Used  . Tobacco comment: CIGARS  Substance and Sexual Activity  . Alcohol use: Yes    Alcohol/week: 0.0 standard drinks  . Drug use: No  . Sexual activity: Not on file  Other Topics Concern  . Not on file  Social History Narrative   Live w/ family   Social Determinants of Health   Financial Resource Strain:   . Difficulty of Paying Living Expenses: Not on file  Food Insecurity:   . Worried About Programme researcher, broadcasting/film/video in the Last Year: Not on file  . Ran Out of Food in the Last Year: Not on file  Transportation Needs:   . Lack of Transportation (Medical): Not on file  . Lack of Transportation (Non-Medical): Not on file  Physical Activity:   . Days of Exercise per Week: Not on file  . Minutes of Exercise per Session: Not on file  Stress:   . Feeling of Stress : Not on file  Social Connections:   . Frequency of Communication with Friends and Family:  Not on file  . Frequency of Social Gatherings with Friends and Family: Not on file  . Attends Religious Services: Not on file  . Active Member of Clubs or Organizations: Not on file  . Attends Banker Meetings: Not on file  . Marital Status: Not on file  Intimate Partner Violence:   . Fear of Current or Ex-Partner: Not on file  . Emotionally Abused: Not on file  . Physically Abused: Not on file  . Sexually Abused: Not on file    Outpatient Medications Prior to Visit  Medication Sig Dispense Refill  . fluticasone (FLONASE) 50 MCG/ACT nasal spray Place into both nostrils daily.    Marland Kitchen omeprazole (PRILOSEC) 40 MG capsule TAKE 1 CAPSULE(40 MG) BY MOUTH DAILY 90 capsule 3  . predniSONE (DELTASONE) 10 MG tablet 4 tablets x 2 days, 3 tabs x 2 days, 2 tabs x 2 days, 1 tab x 2 days 20 tablet 0  . cyclobenzaprine (FLEXERIL) 5 MG tablet Take 1 tablet (5 mg total) by mouth at bedtime. 30  tablet 1  . LORazepam (ATIVAN) 0.5 MG tablet Take 1 tablet (0.5 mg total) by mouth every 8 (eight) hours as needed for anxiety. 30 tablet 01   No facility-administered medications prior to visit.    Allergies  Allergen Reactions  . Aspartame And Phenylalanine     Review of Systems  Constitutional: Positive for malaise/fatigue. Negative for fever.  HENT: Negative for congestion.   Eyes: Negative for blurred vision.  Respiratory: Negative for shortness of breath.   Cardiovascular: Negative for chest pain, palpitations and leg swelling.  Gastrointestinal: Positive for abdominal pain. Negative for blood in stool, constipation, diarrhea, melena and nausea.  Genitourinary: Negative for dysuria and frequency.  Musculoskeletal: Positive for back pain. Negative for falls.  Skin: Negative for rash.  Neurological: Negative for dizziness, loss of consciousness and headaches.  Endo/Heme/Allergies: Negative for environmental allergies.  Psychiatric/Behavioral: Negative for depression. The patient is nervous/anxious.        Objective:    Physical Exam Vitals and nursing note reviewed.  Constitutional:      General: He is not in acute distress.    Appearance: He is well-developed.  HENT:     Head: Normocephalic and atraumatic.     Nose: Nose normal.  Eyes:     General:        Right eye: No discharge.        Left eye: No discharge.  Cardiovascular:     Rate and Rhythm: Normal rate and regular rhythm.     Heart sounds: No murmur.  Pulmonary:     Effort: Pulmonary effort is normal.     Breath sounds: Normal breath sounds.  Abdominal:     General: Bowel sounds are normal.     Palpations: Abdomen is soft.     Tenderness: There is no abdominal tenderness.  Musculoskeletal:     Cervical back: Normal range of motion and neck supple.  Skin:    General: Skin is warm and dry.  Neurological:     Mental Status: He is alert and oriented to person, place, and time.     BP 120/80 (BP  Location: Left Arm, Patient Position: Sitting, Cuff Size: Normal)   Pulse 99   Temp 98.1 F (36.7 C) (Temporal)   Resp 18   Wt 179 lb 6.4 oz (81.4 kg)   SpO2 98%   BMI 25.02 kg/m  Wt Readings from Last 3 Encounters:  11/04/19 179 lb 6.4  oz (81.4 kg)  08/31/18 211 lb (95.7 kg)  03/27/18 206 lb 6.4 oz (93.6 kg)    Diabetic Foot Exam - Simple   No data filed     Lab Results  Component Value Date   WBC 9.7 11/04/2019   HGB 17.0 11/04/2019   HCT 49.1 11/04/2019   PLT 289.0 11/04/2019   GLUCOSE 82 11/04/2019   CHOL 155 11/04/2019   TRIG 65.0 11/04/2019   HDL 37.90 (L) 11/04/2019   LDLDIRECT 88.0 03/09/2017   LDLCALC 104 (H) 11/04/2019   ALT 13 11/04/2019   AST 15 11/04/2019   NA 142 11/04/2019   K 4.0 11/04/2019   CL 104 11/04/2019   CREATININE 0.88 11/04/2019   BUN 12 11/04/2019   CO2 30 11/04/2019   TSH 1.90 11/04/2019    Lab Results  Component Value Date   TSH 1.90 11/04/2019   Lab Results  Component Value Date   WBC 9.7 11/04/2019   HGB 17.0 11/04/2019   HCT 49.1 11/04/2019   MCV 87.3 11/04/2019   PLT 289.0 11/04/2019   Lab Results  Component Value Date   NA 142 11/04/2019   K 4.0 11/04/2019   CO2 30 11/04/2019   GLUCOSE 82 11/04/2019   BUN 12 11/04/2019   CREATININE 0.88 11/04/2019   BILITOT 1.0 11/04/2019   ALKPHOS 96 11/04/2019   AST 15 11/04/2019   ALT 13 11/04/2019   PROT 7.1 11/04/2019   ALBUMIN 4.7 11/04/2019   CALCIUM 9.8 11/04/2019   GFR 97.75 11/04/2019   Lab Results  Component Value Date   CHOL 155 11/04/2019   Lab Results  Component Value Date   HDL 37.90 (L) 11/04/2019   Lab Results  Component Value Date   LDLCALC 104 (H) 11/04/2019   Lab Results  Component Value Date   TRIG 65.0 11/04/2019   Lab Results  Component Value Date   CHOLHDL 4 11/04/2019   No results found for: HGBA1C     Assessment & Plan:   Problem List Items Addressed This Visit    Hemorrhoid    Using Prep H wipes and cream are being used now.  Has been flared since last week.  Will try anusol hc suppositories. Consider witch hazel astringent wipes with each BM. Referred to gastroenterology for further evaluation      Relevant Orders   CBC (Completed)   Epigastric pain    Check abdominal ultrasound and avoid offending foods.       Relevant Orders   TSH (Completed)   US Abdomen Complete   Low back pain    Encouraged moist heat and gentle stretching as tolerated. May try NSAIDs and prescription meds as directed and report if symptoms worsen or seek immediate care. Refill given on Flexeril and given 10 mg dose prn      Relevant Medications   cyclobenzaprine (FLEXERIL) 5 MG tablet   Anxiety - Primary    Does not need meds at work, at home or with daily routine but doctor's visits etc cause him to need Lorazepam sometimes 0.5 mg is inadequate so may take up to 1 mg as needed      Relevant Medications   LORazepam (ATIVAN) 0.5 MG tablet   Hyperlipidemia    Encouraged heart healthy diet, increase exercise, avoid trans fats, consider a krill oil cap daily      Relevant Orders   Comprehensive metabolic panel (Completed)   Lipid panel (Completed)   TSH (Completed)   RUQ pain  Comes and goes and is really only produced by palpation. Notes some pain up into right shoulder blade. The shoulder pain seems to be helped with massage. The shoulder pain is worse when lifting and twisting at work.        Other Visit Diagnoses    Abdominal pain, unspecified abdominal location       Relevant Orders   Urinalysis      I have discontinued Hillis Mcphatter. Blok's cyclobenzaprine. I have also changed his LORazepam and cyclobenzaprine. Additionally, I am having him start on hydrocortisone. Lastly, I am having him maintain his fluticasone, predniSONE, and omeprazole.  Meds ordered this encounter  Medications  . DISCONTD: cyclobenzaprine (FLEXERIL) 5 MG tablet    Sig: Take 1 tablet (5 mg total) by mouth at bedtime.    Dispense:  30 tablet      Refill:  3  . LORazepam (ATIVAN) 0.5 MG tablet    Sig: Take 1-2 tablets (0.5-1 mg total) by mouth every 8 (eight) hours as needed for anxiety.    Dispense:  40 tablet    Refill:  1  . cyclobenzaprine (FLEXERIL) 5 MG tablet    Sig: Take 1-2 tablets (5-10 mg total) by mouth 3 times/day as needed-between meals & bedtime for muscle spasms.    Dispense:  40 tablet    Refill:  3  . hydrocortisone (ANUSOL-HC) 25 MG suppository    Sig: Place 1 suppository (25 mg total) rectally 2 (two) times daily.    Dispense:  12 suppository    Refill:  1     Danise Edge, MD

## 2019-11-04 NOTE — Assessment & Plan Note (Signed)
Comes and goes and is really only produced by palpation. Notes some pain up into right shoulder blade. The shoulder pain seems to be helped with massage. The shoulder pain is worse when lifting and twisting at work.

## 2019-11-04 NOTE — Assessment & Plan Note (Signed)
Does not need meds at work, at home or with daily routine but doctor's visits etc cause him to need Lorazepam sometimes 0.5 mg is inadequate so may take up to 1 mg as needed

## 2019-11-04 NOTE — Assessment & Plan Note (Signed)
Encouraged moist heat and gentle stretching as tolerated. May try NSAIDs and prescription meds as directed and report if symptoms worsen or seek immediate care. Refill given on Flexeril and given 10 mg dose prn

## 2019-11-04 NOTE — Assessment & Plan Note (Signed)
Check abdominal ultrasound and avoid offending foods.

## 2019-11-04 NOTE — Assessment & Plan Note (Addendum)
Using Prep H wipes and cream are being used now. Has been flared since last week.  Will try anusol hc suppositories. Consider witch hazel astringent wipes with each BM. Referred to gastroenterology for further evaluation

## 2019-11-04 NOTE — Patient Instructions (Signed)
Pulse oximeter want oxygen in 90s   Multivitamin with minerals Vitamin D 10-1998 IU daily Aspirin EC 81 mg daily Probiotic daily lactobacilus and bifidophilus   Melatonin 2-5 mg at night   Hemorrhoids Hemorrhoids are swollen veins in and around the rectum or anus. There are two types of hemorrhoids:  Internal hemorrhoids. These occur in the veins that are just inside the rectum. They may poke through to the outside and become irritated and painful.  External hemorrhoids. These occur in the veins that are outside the anus and can be felt as a painful swelling or hard lump near the anus. Most hemorrhoids do not cause serious problems, and they can be managed with home treatments such as diet and lifestyle changes. If home treatments do not help the symptoms, procedures can be done to shrink or remove the hemorrhoids. What are the causes? This condition is caused by increased pressure in the anal area. This pressure may result from various things, including:  Constipation.  Straining to have a bowel movement.  Diarrhea.  Pregnancy.  Obesity.  Sitting for long periods of time.  Heavy lifting or other activity that causes you to strain.  Anal sex.  Riding a bike for a long period of time. What are the signs or symptoms? Symptoms of this condition include:  Pain.  Anal itching or irritation.  Rectal bleeding.  Leakage of stool (feces).  Anal swelling.  One or more lumps around the anus. How is this diagnosed? This condition can often be diagnosed through a visual exam. Other exams or tests may also be done, such as:  An exam that involves feeling the rectal area with a gloved hand (digital rectal exam).  An exam of the anal canal that is done using a small tube (anoscope).  A blood test, if you have lost a significant amount of blood.  A test to look inside the colon using a flexible tube with a camera on the end (sigmoidoscopy or colonoscopy). How is this  treated? This condition can usually be treated at home. However, various procedures may be done if dietary changes, lifestyle changes, and other home treatments do not help your symptoms. These procedures can help make the hemorrhoids smaller or remove them completely. Some of these procedures involve surgery, and others do not. Common procedures include:  Rubber band ligation. Rubber bands are placed at the base of the hemorrhoids to cut off their blood supply.  Sclerotherapy. Medicine is injected into the hemorrhoids to shrink them.  Infrared coagulation. A type of light energy is used to get rid of the hemorrhoids.  Hemorrhoidectomy surgery. The hemorrhoids are surgically removed, and the veins that supply them are tied off.  Stapled hemorrhoidopexy surgery. The surgeon staples the base of the hemorrhoid to the rectal wall. Follow these instructions at home: Eating and drinking   Eat foods that have a lot of fiber in them, such as whole grains, beans, nuts, fruits, and vegetables.  Ask your health care provider about taking products that have added fiber (fiber supplements).  Reduce the amount of fat in your diet. You can do this by eating low-fat dairy products, eating less red meat, and avoiding processed foods.  Drink enough fluid to keep your urine pale yellow. Managing pain and swelling   Take warm sitz baths for 20 minutes, 3-4 times a day to ease pain and discomfort. You may do this in a bathtub or using a portable sitz bath that fits over the toilet.  If  directed, apply ice to the affected area. Using ice packs between sitz baths may be helpful. ? Put ice in a plastic bag. ? Place a towel between your skin and the bag. ? Leave the ice on for 20 minutes, 2-3 times a day. General instructions  Take over-the-counter and prescription medicines only as told by your health care provider.  Use medicated creams or suppositories as told.  Get regular exercise. Ask your health  care provider how much and what kind of exercise is best for you. In general, you should do moderate exercise for at least 30 minutes on most days of the week (150 minutes each week). This can include activities such as walking, biking, or yoga.  Go to the bathroom when you have the urge to have a bowel movement. Do not wait.  Avoid straining to have bowel movements.  Keep the anal area dry and clean. Use wet toilet paper or moist towelettes after a bowel movement.  Do not sit on the toilet for long periods of time. This increases blood pooling and pain.  Keep all follow-up visits as told by your health care provider. This is important. Contact a health care provider if you have:  Increasing pain and swelling that are not controlled by treatment or medicine.  Difficulty having a bowel movement, or you are unable to have a bowel movement.  Pain or inflammation outside the area of the hemorrhoids. Get help right away if you have:  Uncontrolled bleeding from your rectum. Summary  Hemorrhoids are swollen veins in and around the rectum or anus.  Most hemorrhoids can be managed with home treatments such as diet and lifestyle changes.  Taking warm sitz baths can help ease pain and discomfort.  In severe cases, procedures or surgery can be done to shrink or remove the hemorrhoids. This information is not intended to replace advice given to you by your health care provider. Make sure you discuss any questions you have with your health care provider. Document Revised: 02/22/2019 Document Reviewed: 02/15/2018 Elsevier Patient Education  [place].

## 2019-11-04 NOTE — Assessment & Plan Note (Signed)
Encouraged heart healthy diet, increase exercise, avoid trans fats, consider a krill oil cap daily 

## 2019-11-05 LAB — URINALYSIS
Hgb urine dipstick: NEGATIVE
Ketones, ur: 15 — AB
Leukocytes,Ua: NEGATIVE
Nitrite: NEGATIVE
Specific Gravity, Urine: >=1.03 — AB (ref 1.000–1.030)
Total Protein, Urine: NEGATIVE
Urine Glucose: NEGATIVE
Urobilinogen, UA: 0.2 (ref 0.0–1.0)
pH: 6 (ref 5.0–8.0)

## 2019-11-06 ENCOUNTER — Other Ambulatory Visit: Payer: Self-pay | Admitting: *Deleted

## 2019-11-06 DIAGNOSIS — E860 Dehydration: Secondary | ICD-10-CM

## 2019-11-21 ENCOUNTER — Other Ambulatory Visit (INDEPENDENT_AMBULATORY_CARE_PROVIDER_SITE_OTHER): Payer: 59

## 2019-11-21 ENCOUNTER — Other Ambulatory Visit: Payer: Self-pay

## 2019-11-21 ENCOUNTER — Ambulatory Visit (HOSPITAL_BASED_OUTPATIENT_CLINIC_OR_DEPARTMENT_OTHER)
Admission: RE | Admit: 2019-11-21 | Discharge: 2019-11-21 | Disposition: A | Discharge status: Home / Self Care | Payer: 59 | Source: Ambulatory Visit | Attending: Family Medicine | Admitting: Family Medicine

## 2019-11-21 DIAGNOSIS — E860 Dehydration: Secondary | ICD-10-CM

## 2019-11-21 DIAGNOSIS — R1013 Epigastric pain: Secondary | ICD-10-CM | POA: Insufficient documentation

## 2019-11-21 LAB — POC URINALSYSI DIPSTICK (AUTOMATED)
Bilirubin, UA: NEGATIVE
Blood, UA: NEGATIVE
Glucose, UA: NEGATIVE
Ketones, UA: NEGATIVE
Leukocytes, UA: NEGATIVE
Nitrite, UA: NEGATIVE
Protein, UA: NEGATIVE
Spec Grav, UA: 1.01 (ref 1.010–1.025)
Urobilinogen, UA: 0.2 E.U./dL
pH, UA: 8 (ref 5.0–8.0)

## 2019-11-21 NOTE — Progress Notes (Signed)
yellow 

## 2020-03-05 ENCOUNTER — Ambulatory Visit (INDEPENDENT_AMBULATORY_CARE_PROVIDER_SITE_OTHER): Payer: 59 | Admitting: Family Medicine

## 2020-03-05 ENCOUNTER — Encounter: Payer: Self-pay | Admitting: Family Medicine

## 2020-03-05 ENCOUNTER — Other Ambulatory Visit: Payer: Self-pay

## 2020-03-05 VITALS — BP 112/78 | HR 98 | Temp 98.5°F | Resp 12 | Ht 71.0 in | Wt 183.8 lb

## 2020-03-05 DIAGNOSIS — E782 Mixed hyperlipidemia: Secondary | ICD-10-CM | POA: Diagnosis not present

## 2020-03-05 DIAGNOSIS — S30863A Insect bite (nonvenomous) of scrotum and testes, initial encounter: Secondary | ICD-10-CM

## 2020-03-05 DIAGNOSIS — R12 Heartburn: Secondary | ICD-10-CM

## 2020-03-05 DIAGNOSIS — W57XXXD Bitten or stung by nonvenomous insect and other nonvenomous arthropods, subsequent encounter: Secondary | ICD-10-CM | POA: Diagnosis not present

## 2020-03-05 DIAGNOSIS — Z0000 Encounter for general adult medical examination without abnormal findings: Secondary | ICD-10-CM

## 2020-03-05 DIAGNOSIS — W57XXXA Bitten or stung by nonvenomous insect and other nonvenomous arthropods, initial encounter: Secondary | ICD-10-CM | POA: Insufficient documentation

## 2020-03-05 DIAGNOSIS — F419 Anxiety disorder, unspecified: Secondary | ICD-10-CM

## 2020-03-05 DIAGNOSIS — J302 Other seasonal allergic rhinitis: Secondary | ICD-10-CM

## 2020-03-05 NOTE — Assessment & Plan Note (Signed)
Avoid offending foods, start probiotics. Do not eat large meals in late evening and consider raising head of bed.  

## 2020-03-05 NOTE — Assessment & Plan Note (Addendum)
Left testicle he did have course of doxycycline and he feels better. Never had a target lesion but he did have some fevers or chills. No persistent symptoms or lesions.

## 2020-03-05 NOTE — Assessment & Plan Note (Signed)
Had a flare last week after working in the yard

## 2020-03-05 NOTE — Patient Instructions (Addendum)
[place] Probiotic daily  Luckyvitamins.Golda [place], [place] once to twice daily to control diarrhea  Preventive Care 39-37 Years Old, Male Preventive care refers to lifestyle choices and visits with your health care provider that can promote health and wellness. This includes:  A yearly physical exam. This is also called an annual well check.  Regular dental and eye exams.  Immunizations.  Screening for certain conditions.  Healthy lifestyle choices, such as eating a healthy diet, getting regular exercise, not using drugs or products that contain nicotine and tobacco, and limiting alcohol use. What can I expect for my preventive care visit? Physical exam Your health care provider will check:  Height and weight. These may be used to calculate body mass index (BMI), which is a measurement that tells if you are at a healthy weight.  Heart rate and blood pressure.  Your skin for abnormal spots. Counseling Your health care provider may ask you questions about:  Alcohol, tobacco, and drug use.  Emotional well-being.  Home and relationship well-being.  Sexual activity.  Eating habits.  Work and work Statistician. What immunizations do I need?  Influenza (flu) vaccine  This is recommended every year. Tetanus, diphtheria, and pertussis (Tdap) vaccine  You may need a Td booster every 10 years. Varicella (chickenpox) vaccine  You may need this vaccine if you have not already been vaccinated. Human papillomavirus (HPV) vaccine  If recommended by your health care provider, you may need three doses over 6 months. Measles, mumps, and rubella (MMR) vaccine  You may need at least one dose of MMR. You may also need a second dose. Meningococcal conjugate (MenACWY) vaccine  One dose is recommended if you are 59-38 years old and a Market researcher living in a residence hall, or if you have one of several medical conditions. You may also need  additional booster doses. Pneumococcal conjugate (PCV13) vaccine  You may need this if you have certain conditions and were not previously vaccinated. Pneumococcal polysaccharide (PPSV23) vaccine  You may need one or two doses if you smoke cigarettes or if you have certain conditions. Hepatitis A vaccine  You may need this if you have certain conditions or if you travel or work in places where you may be exposed to hepatitis A. Hepatitis B vaccine  You may need this if you have certain conditions or if you travel or work in places where you may be exposed to hepatitis B. Haemophilus influenzae type b (Hib) vaccine  You may need this if you have certain risk factors. You may receive vaccines as individual doses or as more than one vaccine together in one shot (combination vaccines). Talk with your health care provider about the risks and benefits of combination vaccines. What tests do I need? Blood tests  Lipid and cholesterol levels. These may be checked every 5 years starting at age 74.  Hepatitis C test.  Hepatitis B test. Screening   Diabetes screening. This is done by checking your blood sugar (glucose) after you have not eaten for a while (fasting).  Sexually transmitted disease (STD) testing. Talk with your health care provider about your test results, treatment options, and if necessary, the need for more tests. Follow these instructions at home: Eating and drinking   Eat a diet that includes fresh fruits and vegetables, whole grains, lean protein, and low-fat dairy products.  Take vitamin and mineral supplements as recommended by your health care provider.  Do not drink alcohol if your health care  provider tells you not to drink.  If you drink alcohol: ? Limit how much you have to 0-2 drinks a day. ? Be aware of how much alcohol is in your drink. In the [place] one drink equals one 12 oz bottle of beer (355 mL), one 5 oz glass of wine (148 mL), or one 1 oz glass of  hard liquor (44 mL). Lifestyle  Take daily care of your teeth and gums.  Stay active. Exercise for at least 30 minutes on 5 or more days each week.  Do not use any products that contain nicotine or tobacco, such as cigarettes, e-cigarettes, and chewing tobacco. If you need help quitting, ask your health care provider.  If you are sexually active, practice safe sex. Use a condom or other form of protection to prevent STIs (sexually transmitted infections). What's next?  Go to your health care provider once a year for a well check visit.  Ask your health care provider how often you should have your eyes and teeth checked.  Stay up to date on all vaccines. This information is not intended to replace advice given to you by your health care provider. Make sure you discuss any questions you have with your health care provider. Document Revised: 09/20/2018 Document Reviewed: 09/20/2018 Elsevier Patient Education  2020 [employer].

## 2020-03-05 NOTE — Assessment & Plan Note (Addendum)
Stress is up a bit with a possible job change. Symptoms manageable no change to meds

## 2020-03-05 NOTE — Assessment & Plan Note (Signed)
Patient encouraged to maintain heart healthy diet, regular exercise, adequate sleep. Consider daily probiotics. Take medications as prescribed. Labs ordered and reviewed 

## 2020-03-05 NOTE — Assessment & Plan Note (Addendum)
Encouraged heart healthy diet, increase exercise, avoid trans fats, consider a krill oil cap daily, may use Lorazepam prn

## 2020-03-09 NOTE — Progress Notes (Signed)
Patient ID: Scott Molina, male   DOB: Nov 21, 1980, 39 y.o.   MRN: [id number]   Subjective:    Patient ID: Scott Molina, male    DOB: [birth year], 39 y.o.   MRN: [id number]  Chief Complaint  Patient presents with  . Annual Exam    HPI Patient is in today for annual preventative exam and follow up on chronic medical concerns. He feels ell today. No recent febrile illness or hospitalizations. He has been doing well. He has been eating a heart healthy diet and is exercising regularly. He did have a recent tic bite he thinks it moved from left upper inner thigh to left testes and he removed it. It was likely attached greater than 24 hours. He had some itching but that is improving. He had some rash, itching, chills, fevers and COVID testing was negative. Denies CP/palp/SOB/HA/fevers/GI or GU c/o. Taking meds as prescribed  Past Medical History:  Diagnosis Date  . Allergy   . Anxiety    At [employer]  . Anxiety 02/12/2016  . Decreased visual acuity 10/29/2015  . GERD (gastroesophageal reflux disease)   . Heartburn 10/29/2015  . Hemorrhoid 10/29/2015  . History of MRSA infection   . Hyperlipidemia 03/12/2017  . Left shoulder pain 02/12/2016  . Left varicocele 11/08/2015  . Low back pain 11/08/2015  . Nausea with vomiting 10/29/2015  . Preventative health care 10/29/2015  . Seasonal allergies 10/29/2015    Past Surgical History:  Procedure Laterality Date  . TONSILLECTOMY      Family History  Problem Relation Age of Onset  . Diabetes Mother   . Diabetes Father   . Hepatitis C Father   . Arthritis Father        back pain  . Other Father        MRSA sepsis  . Arthritis Sister        knees and back  . Arthritis Brother   . Arthritis Maternal Uncle        wear and tear  . Cancer Paternal Grandmother        ? form  . Testicular cancer Other   . Colon cancer Neg Hx   . Prostate cancer Neg Hx     Social History   Socioeconomic History  . Marital status: Significant Other   Spouse name: Not on file  . Number of children: 0  . Years of education: Not on file  . Highest education level: Not on file  Occupational History  . Occupation: 2 jobs  Tobacco Use  . Smoking status: Former Smoker    Types: Cigars, Pipe  . Smokeless tobacco: Never Used  . Tobacco comment: CIGARS  Substance and Sexual Activity  . Alcohol use: Yes    Alcohol/week: 0.0 standard drinks  . Drug use: No  . Sexual activity: Not on file  Other Topics Concern  . Not on file  Social History Narrative   Live w/ family   Social Determinants of Health   Financial Resource Strain:   . Difficulty of Paying Living Expenses:   Food Insecurity:   . Worried About Programme researcher, broadcasting/film/video in the Last Year:   . Barista in the Last Year:   Transportation Needs:   . Freight forwarder (Medical):   Marland Kitchen Lack of Transportation (Non-Medical):   Physical Activity:   . Days of Exercise per Week:   . Minutes of Exercise per Session:   Stress:   . Feeling of  Stress :   Social Connections:   . Frequency of Communication with Friends and Family:   . Frequency of Social Gatherings with Friends and Family:   . Attends Religious Services:   . Active Member of Clubs or Organizations:   . Attends Banker Meetings:   Marland Kitchen Marital Status:   Intimate Partner Violence:   . Fear of Current or Ex-Partner:   . Emotionally Abused:   Marland Kitchen Physically Abused:   . Sexually Abused:     Outpatient Medications Prior to Visit  Medication Sig Dispense Refill  . cyclobenzaprine (FLEXERIL) 5 MG tablet Take 1-2 tablets (5-10 mg total) by mouth 3 times/day as needed-between meals & bedtime for muscle spasms. 40 tablet 3  . fluticasone (FLONASE) 50 MCG/ACT nasal spray Place into both nostrils daily.    Marland Kitchen LORazepam (ATIVAN) 0.5 MG tablet Take 1-2 tablets (0.5-1 mg total) by mouth every 8 (eight) hours as needed for anxiety. 40 tablet 1  . omeprazole (PRILOSEC) 40 MG capsule TAKE 1 CAPSULE(40 MG) BY MOUTH  DAILY 90 capsule 3  . hydrocortisone (ANUSOL-HC) 25 MG suppository Place 1 suppository (25 mg total) rectally 2 (two) times daily. 12 suppository 1  . predniSONE (DELTASONE) 10 MG tablet 4 tablets x 2 days, 3 tabs x 2 days, 2 tabs x 2 days, 1 tab x 2 days 20 tablet 0   No facility-administered medications prior to visit.    Allergies  Allergen Reactions  . Aspartame And Phenylalanine     Review of Systems  Constitutional: Negative for fever and malaise/fatigue.  HENT: Negative for congestion.   Eyes: Negative for blurred vision.  Respiratory: Negative for shortness of breath.   Cardiovascular: Negative for chest pain, palpitations and leg swelling.  Gastrointestinal: Negative for abdominal pain, blood in stool and nausea.  Genitourinary: Negative for dysuria and frequency.  Musculoskeletal: Negative for falls.  Skin: Negative for rash.  Neurological: Negative for dizziness, loss of consciousness and headaches.  Endo/Heme/Allergies: Negative for environmental allergies.  Psychiatric/Behavioral: Negative for depression. The patient is nervous/anxious.        Objective:    Physical Exam Vitals and nursing note reviewed.  Constitutional:      General: He is not in acute distress.    Appearance: Normal appearance. He is well-developed. He is not ill-appearing.  HENT:     Head: Normocephalic and atraumatic.     Right Ear: Tympanic membrane, ear canal and external ear normal.     Left Ear: Tympanic membrane, ear canal and external ear normal.     Nose: Nose normal.  Eyes:     General:        Right eye: No discharge.        Left eye: No discharge.  Cardiovascular:     Rate and Rhythm: Normal rate and regular rhythm.     Heart sounds: No murmur.  Pulmonary:     Effort: Pulmonary effort is normal.     Breath sounds: Normal breath sounds.  Abdominal:     General: Bowel sounds are normal.     Palpations: Abdomen is soft.     Tenderness: There is no abdominal tenderness.    Musculoskeletal:     Cervical back: Normal range of motion and neck supple.  Skin:    General: Skin is warm and dry.  Neurological:     Mental Status: He is alert and oriented to person, place, and time.  Psychiatric:        Behavior: Behavior normal.  BP 112/78 (BP Location: Left Arm, Cuff Size: Normal)   Pulse 98   Temp 98.5 F (36.9 C) (Temporal)   Resp 12   Ht 5\' 11"  (1.803 m)   Wt 183 lb 12.8 oz (83.4 kg)   SpO2 97%   BMI 25.63 kg/m  Wt Readings from Last 3 Encounters:  03/05/20 183 lb 12.8 oz (83.4 kg)  11/04/19 179 lb 6.4 oz (81.4 kg)  08/31/18 211 lb (95.7 kg)    Diabetic Foot Exam - Simple   No data filed     Lab Results  Component Value Date   WBC 9.7 11/04/2019   HGB 17.0 11/04/2019   HCT 49.1 11/04/2019   PLT 289.0 11/04/2019   GLUCOSE 82 11/04/2019   CHOL 155 11/04/2019   TRIG 65.0 11/04/2019   HDL 37.90 (L) 11/04/2019   LDLDIRECT 88.0 03/09/2017   LDLCALC 104 (H) 11/04/2019   ALT 13 11/04/2019   AST 15 11/04/2019   NA 142 11/04/2019   K 4.0 11/04/2019   CL 104 11/04/2019   CREATININE 0.88 11/04/2019   BUN 12 11/04/2019   CO2 30 11/04/2019   TSH 1.90 11/04/2019    Lab Results  Component Value Date   TSH 1.90 11/04/2019   Lab Results  Component Value Date   WBC 9.7 11/04/2019   HGB 17.0 11/04/2019   HCT 49.1 11/04/2019   MCV 87.3 11/04/2019   PLT 289.0 11/04/2019   Lab Results  Component Value Date   NA 142 11/04/2019   K 4.0 11/04/2019   CO2 30 11/04/2019   GLUCOSE 82 11/04/2019   BUN 12 11/04/2019   CREATININE 0.88 11/04/2019   BILITOT 1.0 11/04/2019   ALKPHOS 96 11/04/2019   AST 15 11/04/2019   ALT 13 11/04/2019   PROT 7.1 11/04/2019   ALBUMIN 4.7 11/04/2019   CALCIUM 9.8 11/04/2019   GFR 97.75 11/04/2019   Lab Results  Component Value Date   CHOL 155 11/04/2019   Lab Results  Component Value Date   HDL 37.90 (L) 11/04/2019   Lab Results  Component Value Date   LDLCALC 104 (H) 11/04/2019   Lab Results   Component Value Date   TRIG 65.0 11/04/2019   Lab Results  Component Value Date   CHOLHDL 4 11/04/2019   No results found for: HGBA1C     Assessment & Plan:   Problem List Items Addressed This Visit    Preventative health care    Patient encouraged to maintain heart healthy diet, regular exercise, adequate sleep. Consider daily probiotics. Take medications as prescribed. Labs ordered and reviewed      Heartburn    Avoid offending foods, start probiotics. Do not eat large meals in late evening and consider raising head of bed.       Seasonal allergies    Had a flare last week after working in the yard      Anxiety    Stress is up a bit with a possible job change. Symptoms manageable no change to meds      Hyperlipidemia    Encouraged heart healthy diet, increase exercise, avoid trans fats, consider a krill oil cap daily, may use Lorazepam prn      Tick bite    Left testicle he did have course of doxycycline and he feels better. Never had a target lesion but he did have some fevers or chills. No persistent symptoms or lesions.          I have discontinued 11/06/2019  S. Kot's predniSONE and hydrocortisone. I am also having him maintain his fluticasone, omeprazole, LORazepam, and cyclobenzaprine.  No orders of the defined types were placed in this encounter.    Penni Homans, MD

## 2020-05-13 ENCOUNTER — Other Ambulatory Visit: Payer: Self-pay | Admitting: Family Medicine

## 2020-05-13 ENCOUNTER — Other Ambulatory Visit: Payer: Self-pay

## 2020-05-13 MED ORDER — OMEPRAZOLE 40 MG PO CPDR: DELAYED_RELEASE_CAPSULE | ORAL | 1 refills | Status: DC

## 2020-07-13 ENCOUNTER — Other Ambulatory Visit: Payer: Self-pay | Admitting: Family Medicine

## 2020-07-14 NOTE — Telephone Encounter (Addendum)
Last written:11/04/19 Last ov:03/05/2020 Next ov:09/10/20 Contract:none PIR:[id number]

## 2020-08-02 IMAGING — US US ABDOMEN COMPLETE
1 series · 14 of 25 positions shown · non-contrast
Comparison: Abdominal ultrasound 08/24/2016.

CLINICAL DATA: Right upper quadrant and epigastric pain.

EXAM:
ABDOMEN ULTRASOUND COMPLETE

[Series 1: us abdomen complete · 14 of 54 slices shown]
[im 1/54]
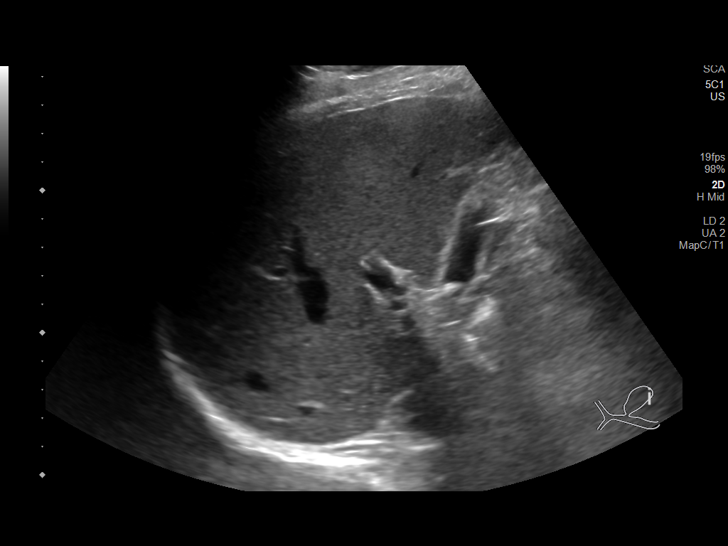
[im 5/54]
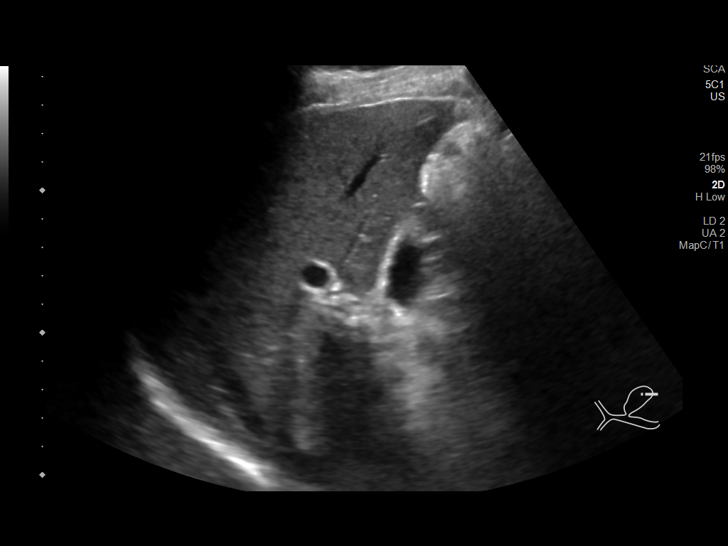
[im 9/54]
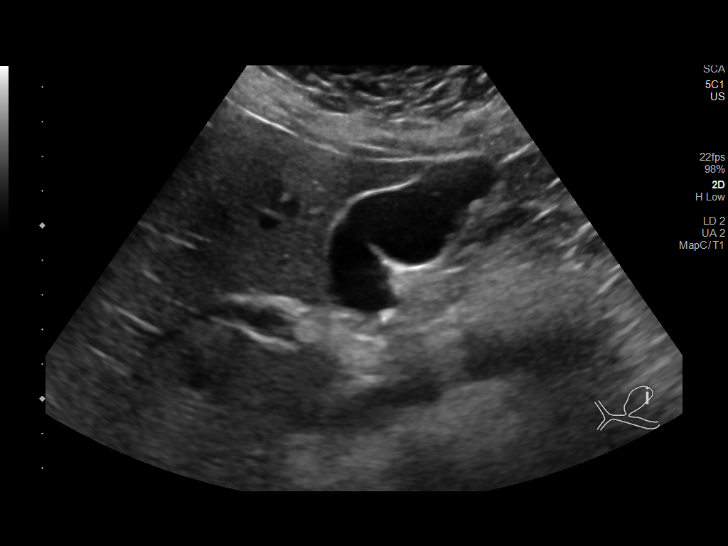
[im 14/54]
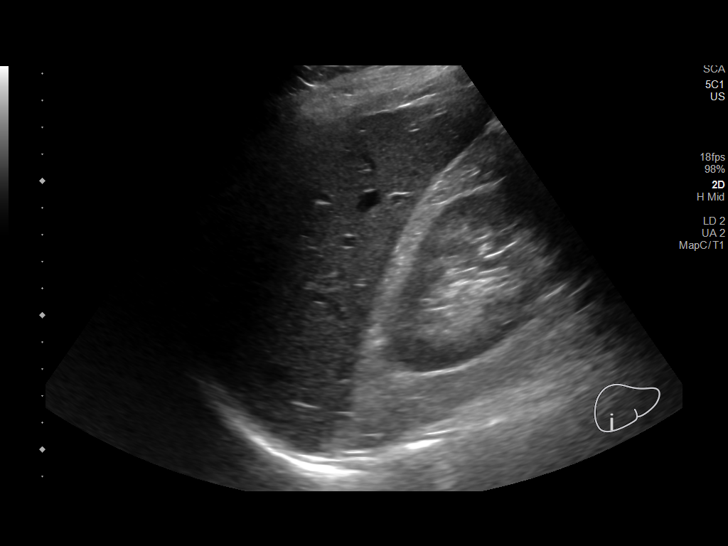
[im 18/54]
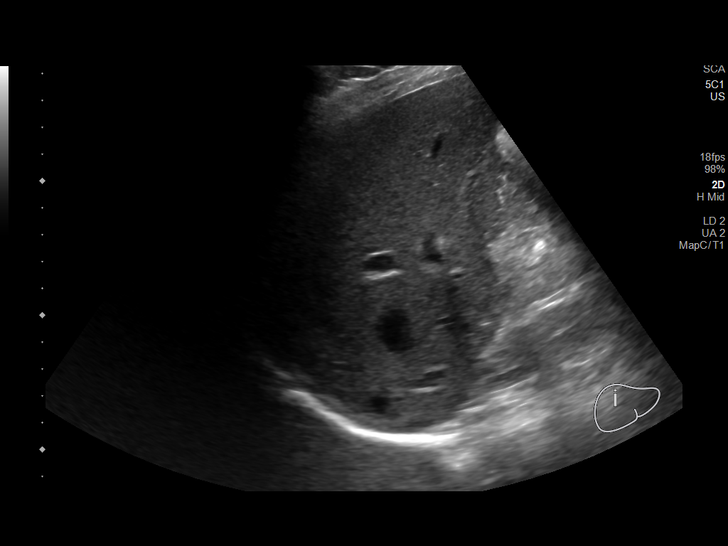
[im 20/54]
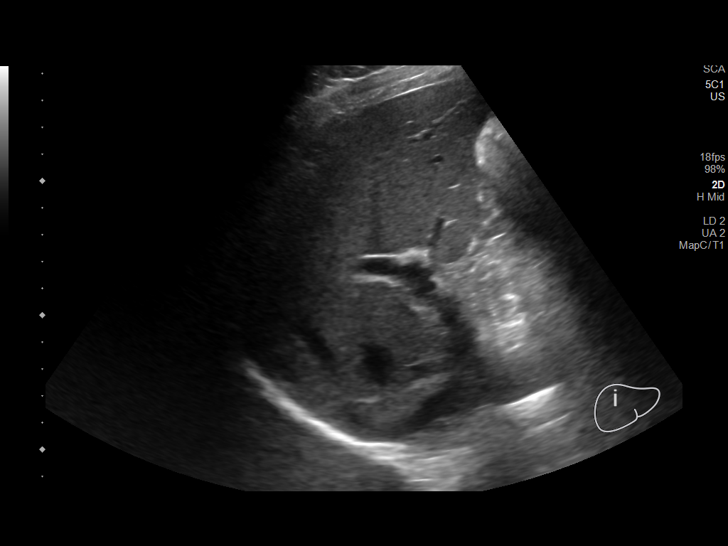
[im 25/54]
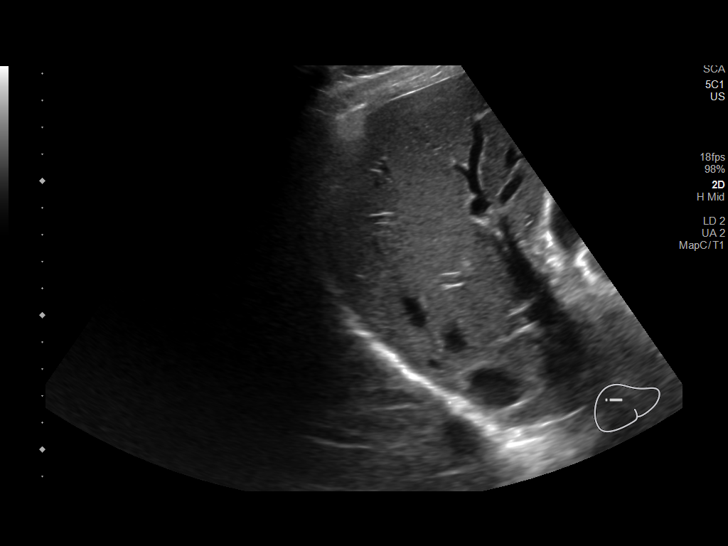
[im 29/54]
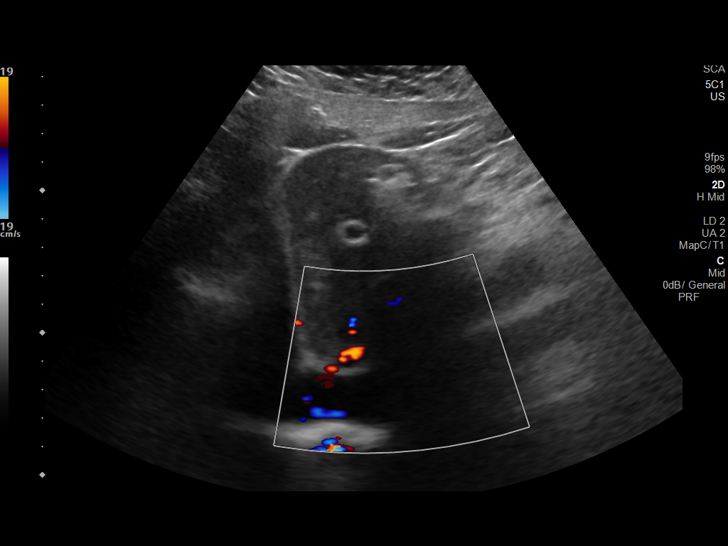
[im 34/54]
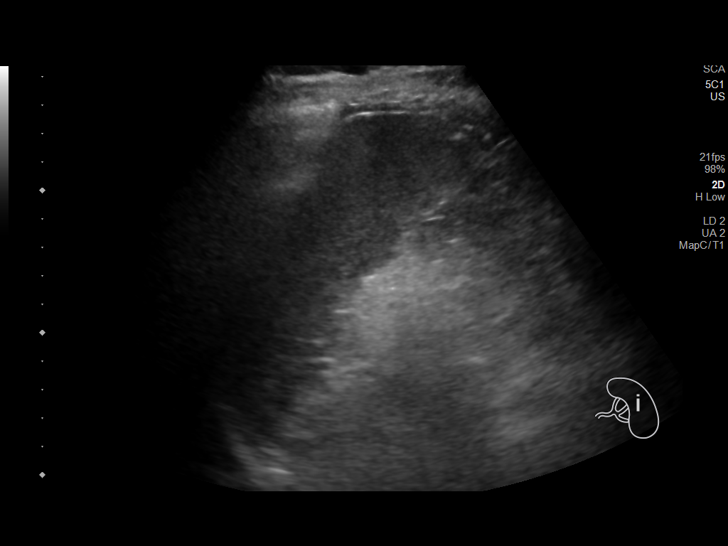
[im 36/54]
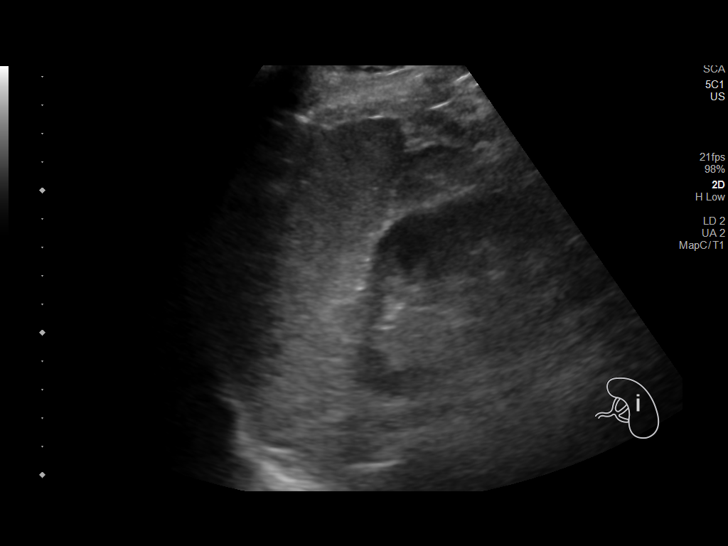
[im 40/54]
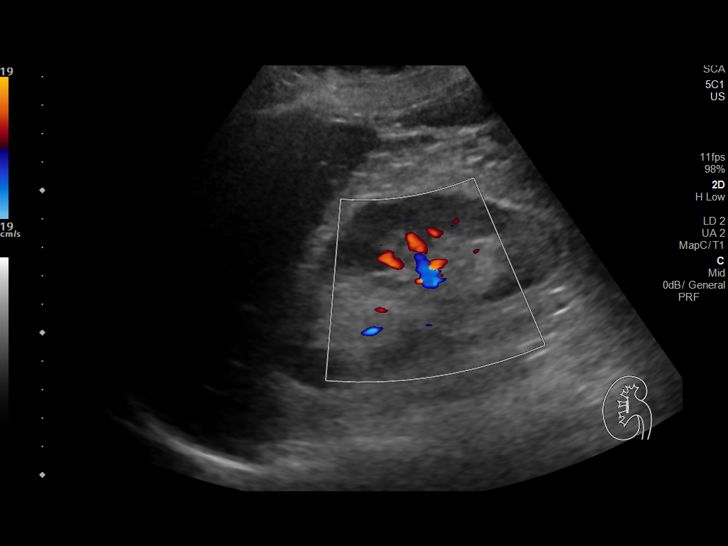
[im 45/54]
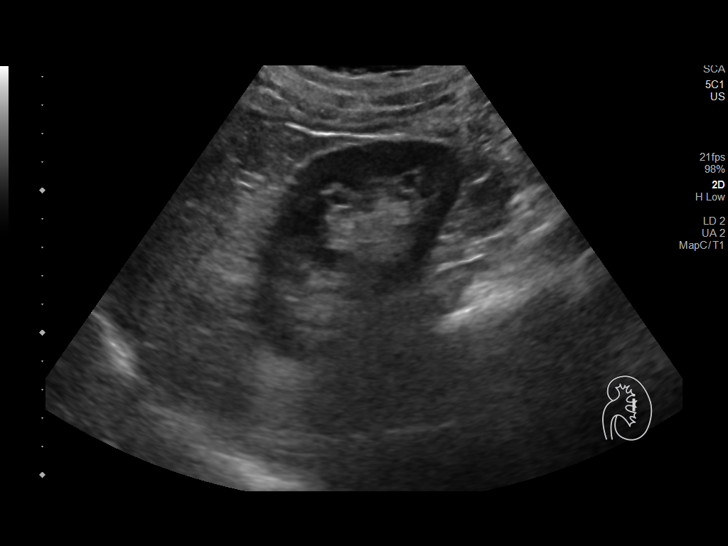
[im 49/54]
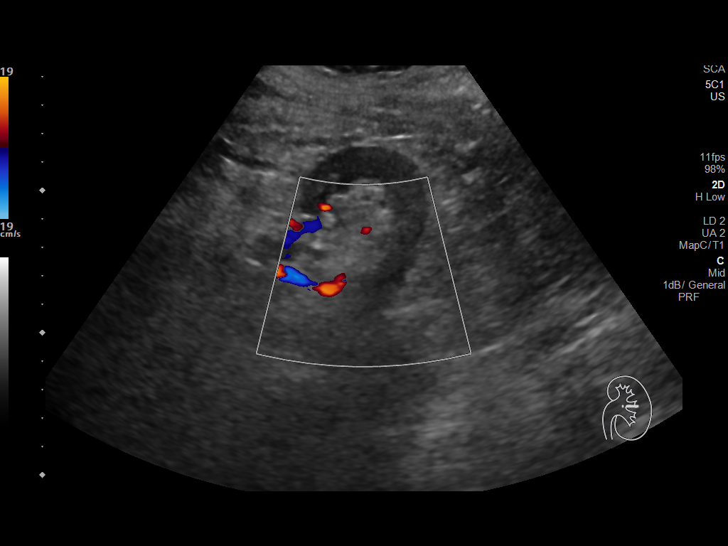
[im 54/54]
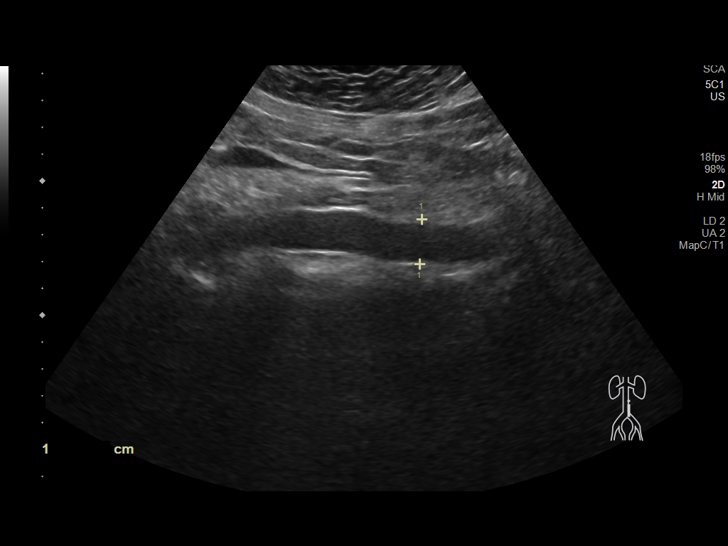

[14 of 25 positions shown; findings below may reference images not displayed]

FINDINGS: Gallbladder: Stones are seen in the gallbladder measuring up to
cm. No gallbladder wall thickening or pericholecystic fluid.
Sonographer reports negative Murphy's sign.

Common bile duct: Diameter: 0.2 cm

Liver: No focal lesion identified. Within normal limits in
parenchymal echogenicity. Portal vein is patent on color Doppler
imaging with normal direction of blood flow towards the liver.

IVC: No abnormality visualized.

Pancreas: Visualized portion unremarkable.

Spleen: Size and appearance within normal limits.

Right Kidney: Length: 11.1 cm. Echogenicity within normal limits. No
mass or hydronephrosis visualized.

Left Kidney: Length: 9.7 cm. Echogenicity within normal limits. No
mass or hydronephrosis visualized.

Abdominal aorta: No aneurysm visualized.

Other findings: None.
IMPRESSION: Small gallstones without evidence of cholecystitis. The examination
is otherwise negative. No finding to explain the patient's symptoms.

## 2020-09-10 ENCOUNTER — Ambulatory Visit: Payer: 59 | Admitting: Family Medicine

## 2020-11-13 ENCOUNTER — Other Ambulatory Visit: Payer: Self-pay | Admitting: Family Medicine

## 2021-03-07 DIAGNOSIS — S90121A Contusion of right lesser toe(s) without damage to nail, initial encounter: Secondary | ICD-10-CM | POA: Diagnosis not present

## 2021-05-12 ENCOUNTER — Other Ambulatory Visit: Payer: Self-pay | Admitting: Family Medicine

## 2021-06-13 ENCOUNTER — Other Ambulatory Visit: Payer: Self-pay | Admitting: Family Medicine

## 2021-06-22 ENCOUNTER — Telehealth: Payer: Self-pay | Admitting: Family Medicine

## 2021-06-22 NOTE — Telephone Encounter (Signed)
Medication: omeprazole (PRILOSEC) 40 MG capsule  Has the patient contacted their pharmacy? Yes.   (If no, request that the patient contact the pharmacy for the refill.) (If yes, when and what did the pharmacy advise?)  Preferred Pharmacy (with phone number or street name):  [id number] - [place], [place] - [address] AT [facility] [address]  [address] [place], [place] [address]  Phone:  [phone]  Fax:  [phone]  Agent: Please be advised that RX refills may take up to 3 business days. We ask that you follow-up with your pharmacy.

## 2021-06-23 ENCOUNTER — Other Ambulatory Visit: Payer: Self-pay

## 2021-06-23 DIAGNOSIS — R12 Heartburn: Secondary | ICD-10-CM

## 2021-06-23 MED ORDER — OMEPRAZOLE 40 MG PO CPDR: DELAYED_RELEASE_CAPSULE | ORAL | 3 refills | Status: DC

## 2021-06-23 NOTE — Telephone Encounter (Signed)
Sent in

## 2021-09-14 ENCOUNTER — Encounter: Payer: Self-pay | Admitting: Family Medicine

## 2021-09-14 ENCOUNTER — Ambulatory Visit (INDEPENDENT_AMBULATORY_CARE_PROVIDER_SITE_OTHER): Payer: BC Managed Care – PPO | Admitting: Family Medicine

## 2021-09-14 ENCOUNTER — Telehealth: Payer: Self-pay | Admitting: Family Medicine

## 2021-09-14 VITALS — BP 124/84 | HR 93 | Temp 98.0°F | Resp 16 | Ht 71.0 in | Wt 196.6 lb

## 2021-09-14 DIAGNOSIS — J069 Acute upper respiratory infection, unspecified: Secondary | ICD-10-CM | POA: Diagnosis not present

## 2021-09-14 DIAGNOSIS — E782 Mixed hyperlipidemia: Secondary | ICD-10-CM | POA: Diagnosis not present

## 2021-09-14 DIAGNOSIS — F419 Anxiety disorder, unspecified: Secondary | ICD-10-CM

## 2021-09-14 DIAGNOSIS — R12 Heartburn: Secondary | ICD-10-CM | POA: Diagnosis not present

## 2021-09-14 DIAGNOSIS — J302 Other seasonal allergic rhinitis: Secondary | ICD-10-CM | POA: Diagnosis not present

## 2021-09-14 DIAGNOSIS — R1011 Right upper quadrant pain: Secondary | ICD-10-CM

## 2021-09-14 LAB — COMPREHENSIVE METABOLIC PANEL
ALT: 23 U/L (ref 0–53)
AST: 20 U/L (ref 0–37)
Albumin: 4.6 g/dL (ref 3.5–5.2)
Alkaline Phosphatase: 95 U/L (ref 39–117)
BUN: 11 mg/dL (ref 6–23)
CO2: 31 mEq/L (ref 19–32)
Calcium: 9.6 mg/dL (ref 8.4–10.5)
Chloride: 103 mEq/L (ref 96–112)
Creatinine, Ser: 0.96 mg/dL (ref 0.40–1.50)
GFR: 100.42 mL/min (ref >60.00)
Glucose, Bld: 93 mg/dL (ref 70–99)
Potassium: 3.6 mEq/L (ref 3.5–5.1)
Sodium: 142 mEq/L (ref 135–145)
Total Bilirubin: 0.8 mg/dL (ref 0.2–1.2)
Total Protein: 7.1 g/dL (ref 6.0–8.3)

## 2021-09-14 LAB — LIPID PANEL
Cholesterol: 149 mg/dL (ref 0–200)
HDL: 41.1 mg/dL (ref >39.00)
LDL Cholesterol: 87 mg/dL (ref 0–99)
NonHDL: 108.12
Total CHOL/HDL Ratio: 4
Triglycerides: 108 mg/dL (ref 0.0–149.0)
VLDL: 21.6 mg/dL (ref 0.0–40.0)

## 2021-09-14 LAB — CBC
HCT: 46.4 % (ref 39.0–52.0)
Hemoglobin: 15.9 g/dL (ref 13.0–17.0)
MCHC: 34.3 g/dL (ref 30.0–36.0)
MCV: 86.3 fl (ref 78.0–100.0)
Platelets: 245 10*3/uL (ref 150.0–400.0)
RBC: 5.38 Mil/uL (ref 4.22–5.81)
RDW: 13.6 % (ref 11.5–15.5)
WBC: 6.6 10*3/uL (ref 4.0–10.5)

## 2021-09-14 LAB — TSH: TSH: 2.12 u[IU]/mL (ref 0.35–5.50)

## 2021-09-14 MED ORDER — CYCLOBENZAPRINE HCL 5 MG PO TABS: 5.0000 mg | ORAL_TABLET | Freq: Two times a day (BID) | ORAL | 2 refills | Status: DC | PRN

## 2021-09-14 MED ORDER — LORAZEPAM 0.5 MG PO TABS
0.5000 mg | ORAL_TABLET | Freq: Two times a day (BID) | ORAL | 1 refills | Status: DC | PRN
Start: 2021-09-14 — End: 2024-02-26

## 2021-09-14 NOTE — Telephone Encounter (Signed)
updated

## 2021-09-14 NOTE — Assessment & Plan Note (Signed)
Was sick before thanksgiving and is still coughing slightly. No productive. Is slowly improving

## 2021-09-14 NOTE — Progress Notes (Signed)
Patient ID: Scott Molina, male    DOB: [birth year]  Age: 40 y.o. MRN: [id number]    Subjective:   Chief Complaint  Patient presents with   Medication Refill    Pt has no concerns or problems   Subjective   HPI Scott Molina presents for office visit today for follow up on hyperlipidemia and medication management. He has no recent ER visits, however he had covid back in August and was also sick before thanksgiving. He had symptoms of yellow mucus and coughs that lasted for 2-3 weeks. He has had fevers and sweating that lasted for 2-3 days. At the moment, he still gets residual throat symptoms. Denies CP/palp/SOB/HA/congestion/fevers/GI or GU c/o. Taking meds as prescribed.  He reports that he takes one Lorazepam and cyclobenzaprine before dentist appts or when experiencing anxiety which is rare for him. He had a recent job change that is not physically demanding so no need for those 2 medications as much.  Review of Systems  Constitutional:  Negative for chills, fatigue and fever.  HENT:  Negative for congestion, rhinorrhea, sinus pressure, sinus pain and sore throat.   Eyes:  Negative for pain.  Respiratory:  Negative for cough and shortness of breath.   Cardiovascular:  Negative for chest pain, palpitations and leg swelling.  Gastrointestinal:  Negative for abdominal pain, blood in stool, diarrhea, nausea and vomiting.  Genitourinary:  Negative for flank pain, frequency and penile pain.  Musculoskeletal:  Negative for back pain.  Neurological:  Negative for headaches.   History Past Medical History:  Diagnosis Date   Allergy    [facility]    [facility]   Anxiety 02/12/2016   Decreased visual acuity 10/29/2015   GERD (gastroesophageal reflux disease)    Heartburn 10/29/2015   Hemorrhoid 10/29/2015   History of MRSA infection    Hyperlipidemia 03/12/2017   Left shoulder pain 02/12/2016   Left varicocele 11/08/2015   Low back pain 11/08/2015   Nausea with vomiting 10/29/2015    Preventative health care 10/29/2015   Seasonal allergies 10/29/2015    He has a past surgical history that includes Tonsillectomy.   His family history includes Arthritis in his brother, father, maternal uncle, and sister; Cancer in his paternal grandmother; Diabetes in his father and mother; Hepatitis C in his father; Other in his father; Testicular cancer in an other family member.He reports that he has quit smoking. His smoking use included cigars and pipe. He has never used smokeless tobacco. He reports current alcohol use. He reports that he does not use drugs.  Current Outpatient Medications on File Prior to Visit  Medication Sig Dispense Refill   fluticasone (FLONASE) 50 MCG/ACT nasal spray Place into both nostrils daily.     omeprazole (PRILOSEC) 40 MG capsule TAKE 1 CAPSULE(40 MG) BY MOUTH DAILY 30 capsule 3   No current facility-administered medications on file prior to visit.     Objective:  Objective  Physical Exam Constitutional:      General: He is not in acute distress.    Appearance: Normal appearance. He is not ill-appearing or toxic-appearing.  HENT:     Head: Normocephalic and atraumatic.     Right Ear: Tympanic membrane, ear canal and external ear normal.     Left Ear: Tympanic membrane, ear canal and external ear normal.     Nose: No congestion or rhinorrhea.  Eyes:     Extraocular Movements: Extraocular movements intact.     Pupils: Pupils are equal, round, and  reactive to light.  Cardiovascular:     Rate and Rhythm: Normal rate and regular rhythm.     Pulses: Normal pulses.     Heart sounds: Normal heart sounds. No murmur heard. Pulmonary:     Effort: Pulmonary effort is normal. No respiratory distress.     Breath sounds: Normal breath sounds. No wheezing, rhonchi or rales.  Abdominal:     General: Bowel sounds are normal.     Palpations: Abdomen is soft. There is no mass.     Tenderness: There is no abdominal tenderness. There is no guarding.     Hernia:  No hernia is present.  Musculoskeletal:        General: Normal range of motion.     Cervical back: Normal range of motion and neck supple.  Skin:    General: Skin is warm and dry.  Neurological:     Mental Status: He is alert and oriented to person, place, and time.  Psychiatric:        Behavior: Behavior normal.   BP 124/84   Pulse 93   Temp 98 F (36.7 C)   Resp 16   Ht 5\' 11"  (1.803 m)   Wt 196 lb 9.6 oz (89.2 kg)   SpO2 98%   BMI 27.42 kg/m  Wt Readings from Last 3 Encounters:  09/14/21 196 lb 9.6 oz (89.2 kg)  03/05/20 183 lb 12.8 oz (83.4 kg)  11/04/19 179 lb 6.4 oz (81.4 kg)     Lab Results  Component Value Date   WBC 9.7 11/04/2019   HGB 17.0 11/04/2019   HCT 49.1 11/04/2019   PLT 289.0 11/04/2019   GLUCOSE 82 11/04/2019   CHOL 155 11/04/2019   TRIG 65.0 11/04/2019   HDL 37.90 (L) 11/04/2019   LDLDIRECT 88.0 03/09/2017   LDLCALC 104 (H) 11/04/2019   ALT 13 11/04/2019   AST 15 11/04/2019   NA 142 11/04/2019   K 4.0 11/04/2019   CL 104 11/04/2019   CREATININE 0.88 11/04/2019   BUN 12 11/04/2019   CO2 30 11/04/2019   TSH 1.90 11/04/2019    11/06/2019 Abdomen Complete  Result Date: 11/21/2019 CLINICAL DATA:  Right upper quadrant and epigastric pain. EXAM: ABDOMEN ULTRASOUND COMPLETE COMPARISON:  Abdominal ultrasound 08/24/2016. FINDINGS: Gallbladder: Stones are seen in the gallbladder measuring up to 0.8 cm. No gallbladder wall thickening or pericholecystic fluid. Sonographer reports negative Murphy's sign. Common bile duct: Diameter: 0.2 cm Liver: No focal lesion identified. Within normal limits in parenchymal echogenicity. Portal vein is patent on color Doppler imaging with normal direction of blood flow towards the liver. IVC: No abnormality visualized. Pancreas: Visualized portion unremarkable. Spleen: Size and appearance within normal limits. Right Kidney: Length: 11.1 cm. Echogenicity within normal limits. No mass or hydronephrosis visualized. Left Kidney:  Length: 9.7 cm. Echogenicity within normal limits. No mass or hydronephrosis visualized. Abdominal aorta: No aneurysm visualized. Other findings: None. IMPRESSION: Small gallstones without evidence of cholecystitis. The examination is otherwise negative. No finding to explain the patient's symptoms. Electronically Signed   By: 08/26/2016 M.D.   On: 11/21/2019 14:28     Assessment & Plan:  Plan    Meds ordered this encounter  Medications   cyclobenzaprine (FLEXERIL) 5 MG tablet    Sig: Take 1-2 tablets (5-10 mg total) by mouth 3 times/day as needed-between meals & bedtime for muscle spasms.    Dispense:  30 tablet    Refill:  2   LORazepam (ATIVAN) 0.5 MG tablet  Sig: Take 1 tablet (0.5 mg total) by mouth 2 (two) times daily as needed for anxiety.    Dispense:  20 tablet    Refill:  1    Problem List Items Addressed This Visit     Heartburn - Primary   Relevant Orders   CBC   TSH   Seasonal allergies   Relevant Orders   TSH   Anxiety    Doing well, uses Lorazepam prn, really only uses it prior to the dentist. Uses Cyclobenzaprine for this with good results      Relevant Medications   LORazepam (ATIVAN) 0.5 MG tablet   Hyperlipidemia   Relevant Orders   Comprehensive metabolic panel   Lipid panel   TSH   RUQ pain    That has improved with diminishing fatty foods, only tender once since he was last year. Has cut down on ultra processed foods with lower salt, sugar and fatty foods.       Upper respiratory tract infection    Was sick before thanksgiving and is still coughing slightly. No productive. Is slowly improving      Relevant Orders   CBC   TSH    Follow-up: Return in about 6 months (around 03/15/2022) for cpe.  I, Billie Lade, acting as a scribe for Danise Edge, MD, have documented all relevent documentation on behalf of Danise Edge, MD, as directed by Danise Edge, MD while in the presence of Danise Edge, MD. DO:09/14/21.  I, Bradd Canary, MD  personally performed the services described in this documentation. All medical record entries made by the scribe were at my direction and in my presence. I have reviewed the chart and agree that the record reflects my personal performance and is accurate and complete

## 2021-09-14 NOTE — Assessment & Plan Note (Signed)
That has improved with diminishing fatty foods, only tender once since he was last year. Has cut down on ultra processed foods with lower salt, sugar and fatty foods.

## 2021-09-14 NOTE — Telephone Encounter (Signed)
Pt would like to update his pharmacy to the following:  [facility] [address], [place] [address]  [phone]

## 2021-09-14 NOTE — Assessment & Plan Note (Addendum)
Doing well, uses Lorazepam prn, really only uses it prior to the dentist. Uses Cyclobenzaprine for this with good results

## 2021-09-14 NOTE — Patient Instructions (Signed)

## 2021-10-21 ENCOUNTER — Other Ambulatory Visit: Payer: Self-pay | Admitting: Family Medicine

## 2021-10-21 DIAGNOSIS — R12 Heartburn: Secondary | ICD-10-CM

## 2022-01-22 ENCOUNTER — Other Ambulatory Visit: Payer: Self-pay | Admitting: Family Medicine

## 2022-01-22 DIAGNOSIS — R12 Heartburn: Secondary | ICD-10-CM

## 2022-03-24 ENCOUNTER — Encounter: Payer: BC Managed Care – PPO | Admitting: Family Medicine

## 2022-05-30 ENCOUNTER — Other Ambulatory Visit: Payer: Self-pay

## 2022-05-30 DIAGNOSIS — R12 Heartburn: Secondary | ICD-10-CM

## 2022-05-30 MED ORDER — OMEPRAZOLE 40 MG PO CPDR: 40.0000 mg | DELAYED_RELEASE_CAPSULE | Freq: Every day | ORAL | 0 refills | Status: DC

## 2022-06-05 NOTE — Assessment & Plan Note (Deleted)
Doing well. No recent exacerbations.

## 2022-06-05 NOTE — Assessment & Plan Note (Deleted)
Patient encouraged to maintain heart healthy diet, regular exercise, adequate sleep. Consider daily probiotics. Take medications as prescribed. Patient encouraged to maintain heart healthy diet, regular exercise, adequate sleep. Consider daily probiotics. Take medications as prescribed 

## 2022-06-05 NOTE — Assessment & Plan Note (Deleted)
Encourage heart healthy diet such as MIND or DASH diet, increase exercise, avoid trans fats, simple carbohydrates and processed foods, consider a krill or fish or flaxseed oil cap daily.  °

## 2022-06-06 ENCOUNTER — Encounter: Payer: BC Managed Care – PPO | Admitting: Family Medicine

## 2022-06-06 DIAGNOSIS — E782 Mixed hyperlipidemia: Secondary | ICD-10-CM

## 2022-06-06 DIAGNOSIS — Z0000 Encounter for general adult medical examination without abnormal findings: Secondary | ICD-10-CM

## 2022-06-06 DIAGNOSIS — F419 Anxiety disorder, unspecified: Secondary | ICD-10-CM

## 2022-07-01 ENCOUNTER — Other Ambulatory Visit: Payer: Self-pay

## 2022-07-01 DIAGNOSIS — R12 Heartburn: Secondary | ICD-10-CM

## 2022-07-01 MED ORDER — OMEPRAZOLE 40 MG PO CPDR: 40.0000 mg | DELAYED_RELEASE_CAPSULE | Freq: Every day | ORAL | 0 refills | Status: DC

## 2022-07-04 ENCOUNTER — Other Ambulatory Visit: Payer: Self-pay

## 2022-07-04 DIAGNOSIS — R12 Heartburn: Secondary | ICD-10-CM

## 2022-07-04 MED ORDER — OMEPRAZOLE 40 MG PO CPDR: 40.0000 mg | DELAYED_RELEASE_CAPSULE | Freq: Every day | ORAL | 0 refills | Status: DC

## 2022-08-04 ENCOUNTER — Other Ambulatory Visit: Payer: Self-pay | Admitting: Family Medicine

## 2022-08-04 DIAGNOSIS — R12 Heartburn: Secondary | ICD-10-CM

## 2022-09-01 ENCOUNTER — Other Ambulatory Visit: Payer: Self-pay | Admitting: Family Medicine

## 2022-09-01 DIAGNOSIS — R12 Heartburn: Secondary | ICD-10-CM

## 2022-10-02 ENCOUNTER — Other Ambulatory Visit: Payer: Self-pay | Admitting: Family Medicine

## 2022-10-02 DIAGNOSIS — R12 Heartburn: Secondary | ICD-10-CM

## 2022-10-04 ENCOUNTER — Other Ambulatory Visit: Payer: Self-pay | Admitting: Family Medicine

## 2022-10-04 DIAGNOSIS — R12 Heartburn: Secondary | ICD-10-CM

## 2023-07-03 ENCOUNTER — Other Ambulatory Visit: Payer: Self-pay | Admitting: Family Medicine

## 2023-07-03 DIAGNOSIS — R12 Heartburn: Secondary | ICD-10-CM

## 2024-02-19 ENCOUNTER — Encounter (HOSPITAL_COMMUNITY): Payer: Self-pay

## 2024-02-26 ENCOUNTER — Encounter: Payer: Self-pay | Admitting: Student in an Organized Health Care Education/Training Program

## 2024-02-26 ENCOUNTER — Ambulatory Visit: Admitting: Student in an Organized Health Care Education/Training Program

## 2024-02-26 VITALS — BP 129/90 | HR 83 | Temp 98.0°F | Ht 71.0 in | Wt 174.6 lb

## 2024-02-26 DIAGNOSIS — F419 Anxiety disorder, unspecified: Secondary | ICD-10-CM | POA: Diagnosis not present

## 2024-02-26 DIAGNOSIS — Z131 Encounter for screening for diabetes mellitus: Secondary | ICD-10-CM | POA: Diagnosis not present

## 2024-02-26 DIAGNOSIS — K219 Gastro-esophageal reflux disease without esophagitis: Secondary | ICD-10-CM

## 2024-02-26 DIAGNOSIS — Z13220 Encounter for screening for lipoid disorders: Secondary | ICD-10-CM

## 2024-02-26 DIAGNOSIS — K8020 Calculus of gallbladder without cholecystitis without obstruction: Secondary | ICD-10-CM | POA: Diagnosis not present

## 2024-02-26 DIAGNOSIS — Z0000 Encounter for general adult medical examination without abnormal findings: Secondary | ICD-10-CM

## 2024-02-26 NOTE — Assessment & Plan Note (Signed)
 Chronic.  Previously treated with lorazepam  but not on medications now.  Symptoms are not inhibiting him, not bothering him.  Can offer treatment with SSRI in future if needed.

## 2024-02-26 NOTE — Assessment & Plan Note (Signed)
 Chronic and stable.  Symptoms are well treated with omeprazole  40 mg daily.

## 2024-02-26 NOTE — Assessment & Plan Note (Signed)
 Chronic issue.  Diagnosed on ultrasound about 2 years ago.  Symptom burden is very low.  Only about 4 episodes of mild biliary colic over the last 6 months.  No symptoms to suggest cholecystitis.  We talked about the natural progression of this condition.  I recommended surgical cholecystectomy once biliary colic becomes bothersome to his quality of life.  We talked about symptoms that would need more urgent surgery would like cholecystitis.

## 2024-02-26 NOTE — Progress Notes (Signed)
 New Patient Office Visit  Subjective    Patient ID: Scott Molina, male    DOB: 1981-02-16  Age: 43 y.o. MRN: [id number]  CC: Abdominal discomfort  HPI  Scott Molina presents to establish care  43 year old person who is otherwise healthy, history of anxiety, here for a few different concerns.  Reports having a history of gallstones, had a consultation with surgery years ago and they decided that the stones were too small and the biliary colic symptoms were too infrequent to justify cholecystectomy at that time.  Over last 6 months he has had a few episodes of right upper quadrant abdominal discomfort, usually only lasting seconds-minutes.  Reports about 4 episodes monthly.  No fevers or chills.  No nausea or vomiting.  Totally functional eating and drinking well.  He lives at home with his wife, who is a International aid/development worker.  He was working night shifts at [employer], the left that job about a year ago and has been out of work since then.  Reports that his mood has been good, denies any anxiety affecting his daily life.  Exercises inconsistently, but nothing physical limiting him.  No shortness of breath or chest discomfort.  Occasional left knee pain but none today.  He has no concerns about alcohol use or tobacco use.    Outpatient Encounter Medications as of 02/26/2024  Medication Sig   omeprazole  (PRILOSEC) 40 MG capsule TAKE 1 CAPSULE(40 MG) BY MOUTH DAILY   [DISCONTINUED] cyclobenzaprine  (FLEXERIL ) 5 MG tablet Take 1-2 tablets (5-10 mg total) by mouth 3 times/day as needed-between meals & bedtime for muscle spasms.   [DISCONTINUED] fluticasone (FLONASE) 50 MCG/ACT nasal spray Place into both nostrils daily.   [DISCONTINUED] LORazepam  (ATIVAN ) 0.5 MG tablet Take 1 tablet (0.5 mg total) by mouth 2 (two) times daily as needed for anxiety.   No facility-administered encounter medications on file as of 02/26/2024.    Past Medical History:  Diagnosis Date   Allergy    Anxiety    At [facility]   Anxiety 02/12/2016   Decreased visual acuity 10/29/2015   GERD (gastroesophageal reflux disease)    Heartburn 10/29/2015   Hemorrhoid 10/29/2015   History of MRSA infection    Hyperlipidemia 03/12/2017   Left shoulder pain 02/12/2016   Left varicocele 11/08/2015   Low back pain 11/08/2015   Nausea with vomiting 10/29/2015   Preventative health care 10/29/2015   Seasonal allergies 10/29/2015    Past Surgical History:  Procedure Laterality Date   TONSILLECTOMY      Family History  Problem Relation Age of Onset   Diabetes Mother    Diabetes Father    Hepatitis C Father    Arthritis Father        back pain   Other Father        MRSA sepsis   Arthritis Sister        knees and back   Arthritis Brother    Arthritis Maternal Uncle        wear and tear   Cancer Paternal Grandmother        ? form   Testicular cancer Other    Colon cancer Neg Hx    Prostate cancer Neg Hx     Social History   Socioeconomic History   Marital status: Significant Other    Spouse name: Not on file   Number of children: 0   Years of education: Not on file   Highest education level: Not on file  Occupational History   Occupation: Unemployed  Tobacco Use   Smoking status: Former    Types: Cigars, Pipe   Smokeless tobacco: Never   Tobacco comments:    CIGARS  Substance and Sexual Activity   Alcohol use: Yes    Alcohol/week: 0.0 standard drinks of alcohol   Drug use: No   Sexual activity: Not on file  Other Topics Concern   Not on file  Social History Narrative   Live w/ family   Social Drivers of Corporate investment banker Strain: Not on file  Food Insecurity: Not on file  Transportation Needs: Not on file  Physical Activity: Not on file  Stress: Not on file  Social Connections: Not on file  Intimate Partner Violence: Not on file        Objective    BP (!) 129/90 (BP Location: Right Arm, Patient Position: Sitting, Cuff Size: Normal)   Pulse 83   Temp 98 F (36.7 C)  (Oral)   Ht 5\' 11"  (1.803 m)   Wt 174 lb 9.6 oz (79.2 kg)   SpO2 99%   BMI 24.35 kg/m   Physical Exam  Gen: Well-appearing Eyes: Normal Ears: Normal bilateral tympanic membranes Neck: Normal thyroid , no nodules or adenopathy Heart: Regular, no murmur Lungs: Unlabored, clear throughout Abd: Soft, nontender, no organomegaly Ext: Warm, no edema, normal joints Neuro: Alert, conversational, full strength upper and lower extremities, normal gait, normal balance Psych: Mildly anxious appearing, mildly pressured speech, not depressed appearing, organized speech, linear thoughts, pleasure to talk with  POCUS: Abdominal ultrasound done because of history of cholelithiasis and biliary colic symptoms.  The gallbladder was in the superficial right upper quadrant, normal gallbladder diameter, no large stones seen, no tenderness with compression of the gallbladder.     Assessment & Plan:   Problem List Items Addressed This Visit       Medium    Anxiety (Chronic)   Chronic.  Previously treated with lorazepam  but not on medications now.  Symptoms are not inhibiting him, not bothering him.  Can offer treatment with SSRI in future if needed.      Cholelithiasis - Primary (Chronic)   Chronic issue.  Diagnosed on ultrasound about 2 years ago.  Symptom burden is very low.  Only about 4 episodes of mild biliary colic over the last 6 months.  No symptoms to suggest cholecystitis.  We talked about the natural progression of this condition.  I recommended surgical cholecystectomy once biliary colic becomes bothersome to his quality of life.  We talked about symptoms that would need more urgent surgery would like cholecystitis.        Low   Preventative health care (Chronic)   Very healthy individual.  Blood pressure at the upper limit of normal, looks like prehypertension.  Weight is healthy with a BMI of 24.  I recommended lipids and a1c to screen for risk of ischemic vascular disease and diabetes.   Patient says he will think about this and maybe get it later this week.  I recommended a tetanus vaccine, patient declined this today.      GERD (gastroesophageal reflux disease) (Chronic)   Chronic and stable.  Symptoms are well treated with omeprazole  40 mg daily.      Other Visit Diagnoses       Screening for diabetes mellitus       Relevant Orders   Hemoglobin A1c     Screening for hyperlipidemia       Relevant Orders  Lipid panel      I spent 45 minutes with the patient face-to-face history taking, physical examination, counseling, and medical decision making.  Return in about 1 year (around 02/25/2025).   Ether Hercules, MD

## 2024-02-26 NOTE — Assessment & Plan Note (Signed)
 Very healthy individual.  Blood pressure at the upper limit of normal, looks like prehypertension.  Weight is healthy with a BMI of 24.  I recommended lipids and a1c to screen for risk of ischemic vascular disease and diabetes.  Patient says he will think about this and maybe get it later this week.  I recommended a tetanus vaccine, patient declined this today.

## 2024-03-14 DIAGNOSIS — L578 Other skin changes due to chronic exposure to nonionizing radiation: Secondary | ICD-10-CM | POA: Diagnosis not present

## 2024-03-14 DIAGNOSIS — L814 Other melanin hyperpigmentation: Secondary | ICD-10-CM | POA: Diagnosis not present

## 2024-03-14 DIAGNOSIS — L570 Actinic keratosis: Secondary | ICD-10-CM | POA: Diagnosis not present

## 2024-03-14 DIAGNOSIS — L821 Other seborrheic keratosis: Secondary | ICD-10-CM | POA: Diagnosis not present

## 2024-03-14 DIAGNOSIS — D229 Melanocytic nevi, unspecified: Secondary | ICD-10-CM | POA: Diagnosis not present

## 2024-03-26 ENCOUNTER — Telehealth: Payer: Self-pay | Admitting: Student in an Organized Health Care Education/Training Program

## 2024-03-26 ENCOUNTER — Other Ambulatory Visit: Payer: Self-pay | Admitting: Family Medicine

## 2024-03-26 ENCOUNTER — Telehealth: Payer: Self-pay

## 2024-03-26 ENCOUNTER — Other Ambulatory Visit: Payer: Self-pay

## 2024-03-26 DIAGNOSIS — R12 Heartburn: Secondary | ICD-10-CM

## 2024-03-26 DIAGNOSIS — K8020 Calculus of gallbladder without cholecystitis without obstruction: Secondary | ICD-10-CM

## 2024-03-26 MED ORDER — OMEPRAZOLE 40 MG PO CPDR: 40.0000 mg | DELAYED_RELEASE_CAPSULE | Freq: Every day | ORAL | 2 refills | Status: DC

## 2024-03-26 NOTE — Telephone Encounter (Signed)
 Noted

## 2024-03-26 NOTE — Telephone Encounter (Signed)
 Patient came in with questions regarding blood work, I noted the future blood work for A1C and lipid but patient wanted to know if other things needed to be drawn since it's been since 2022 that he's had things such as his TSH, CBC, and CMP had been checked. I did let him know that I would get a message back to Dr. Gayl Katos to ask if these others can be ordered or if he advises any differently.    We did get his lab visit scheduled and we scheduled his physical for next year. He declined the tetanus vaccine at this time also.He is aware Dr. Gayl Katos is out of the office and is ok to wait for his answer when he returns.

## 2024-03-26 NOTE — Telephone Encounter (Signed)
 Copied from CRM [id number]. Topic: General - Call Back - No Documentation >> Mar 26, 2024 10:07 AM Lajean Pike wrote: Reason for CRM: Patient returned call to [facility] and stated that he is with a new provider Dr. Gayl Katos at [facility]. He had to choose a provider that was closer to home. Patient may be contacted at [phone].

## 2024-04-01 NOTE — Addendum Note (Signed)
 Addended by: JERRELL SOLIAN T on: 04/01/2024 07:58 AM   Modules accepted: Orders

## 2024-04-01 NOTE — Telephone Encounter (Signed)
 Adding a CMP is optional.  I placed a future order for this.  He can have this drawn with the other lab work that is already ordered.

## 2024-04-03 ENCOUNTER — Other Ambulatory Visit (INDEPENDENT_AMBULATORY_CARE_PROVIDER_SITE_OTHER)

## 2024-04-03 DIAGNOSIS — K8020 Calculus of gallbladder without cholecystitis without obstruction: Secondary | ICD-10-CM | POA: Diagnosis not present

## 2024-04-03 DIAGNOSIS — Z13220 Encounter for screening for lipoid disorders: Secondary | ICD-10-CM

## 2024-04-03 DIAGNOSIS — Z131 Encounter for screening for diabetes mellitus: Secondary | ICD-10-CM

## 2024-04-03 LAB — COMPREHENSIVE METABOLIC PANEL WITH GFR
ALT: 25 U/L (ref 0–53)
AST: 21 U/L (ref 0–37)
Albumin: 4.6 g/dL (ref 3.5–5.2)
Alkaline Phosphatase: 90 U/L (ref 39–117)
BUN: 14 mg/dL (ref 6–23)
CO2: 30 meq/L (ref 19–32)
Calcium: 9.4 mg/dL (ref 8.4–10.5)
Chloride: 103 meq/L (ref 96–112)
Creatinine, Ser: 0.93 mg/dL (ref 0.40–1.50)
GFR: 102.46 mL/min (ref >60.00)
Glucose, Bld: 85 mg/dL (ref 70–99)
Potassium: 3.6 meq/L (ref 3.5–5.1)
Sodium: 140 meq/L (ref 135–145)
Total Bilirubin: 1.1 mg/dL (ref 0.2–1.2)
Total Protein: 7.3 g/dL (ref 6.0–8.3)

## 2024-04-03 LAB — LIPID PANEL
Cholesterol: 138 mg/dL (ref 0–200)
HDL: 36.1 mg/dL — ABNORMAL LOW (ref >39.00)
LDL Cholesterol: 82 mg/dL (ref 0–99)
NonHDL: 101.85
Total CHOL/HDL Ratio: 4
Triglycerides: 101 mg/dL (ref 0.0–149.0)
VLDL: 20.2 mg/dL (ref 0.0–40.0)

## 2024-04-03 LAB — HEMOGLOBIN A1C: Hgb A1c MFr Bld: 4.9 % (ref 4.6–6.5)

## 2024-04-04 ENCOUNTER — Ambulatory Visit: Payer: Self-pay | Admitting: Student in an Organized Health Care Education/Training Program

## 2024-07-23 ENCOUNTER — Ambulatory Visit: Admitting: Student in an Organized Health Care Education/Training Program

## 2024-07-23 ENCOUNTER — Ambulatory Visit: Payer: Self-pay

## 2024-07-23 ENCOUNTER — Encounter: Payer: Self-pay | Admitting: Student in an Organized Health Care Education/Training Program

## 2024-07-23 VITALS — BP 106/73 | HR 82 | Ht 71.0 in | Wt 175.8 lb

## 2024-07-23 DIAGNOSIS — L03314 Cellulitis of groin: Secondary | ICD-10-CM | POA: Insufficient documentation

## 2024-07-23 MED ORDER — DOXYCYCLINE HYCLATE 100 MG PO TABS: 100.0000 mg | ORAL_TABLET | Freq: Two times a day (BID) | ORAL | 0 refills | Status: AC

## 2024-07-23 NOTE — Assessment & Plan Note (Signed)
 A small superficial skin and soft tissue infection on the left side of the groin in the skin fold seems very low risk.  It looks to be healing with a low risk of progression due to normal weight and absence of diabetes.  I do not detect any fluctuance.  Is been there about 10 days, so I think it is already fibrosing, I do not think incision and drainage would be much help based on the exam.  Prescribed doxycycline 100 mg orally twice a day for 5 days. Advised avoiding scratching. Instruct to monitor for increased redness, spreading, fever, or testicular pain and return if these occur.

## 2024-07-23 NOTE — Patient Instructions (Signed)
  VISIT SUMMARY: Today, you were seen for a skin lesion in your groin area that has been causing some discomfort. We discussed your history of similar issues and your concerns about the lesion potentially spreading. We also reviewed your occasional lip bumps and your management of gallbladder issues.  YOUR PLAN: -SKIN ABSCESS OF GROIN: A skin abscess is a collection of pus that forms under the skin. You have a small abscess in your groin area that is likely healing. You will take doxycycline 100 mg orally twice a day for 5 days. Avoid scratching the area and monitor for increased redness, spreading, fever, or testicular pain. Return if these symptoms occur.  -ANXIETY: Your anxiety is related to the abscess and uncertainty about treatment. The risk of the abscess getting worse is low, and the antibiotic treatment should be effective. We also discussed the low risk of serious diarrhea with doxycycline and its potential side effects.  -RECURRENT LIP BUMPS OF UNCERTAIN ETIOLOGY: You have occasional small bumps on your lips that resolve within 24 hours. These are not consistent with herpes and may be triggered by certain foods or products. The exact cause is still uncertain.  -CHOLELITHIASIS WITHOUT CURRENT SYMPTOMS: Cholelithiasis means having gallstones without symptoms. You are managing this by controlling your dietary fat intake to prevent any gallbladder symptoms. Continue with your current dietary management.  INSTRUCTIONS: Please take doxycycline 100 mg orally twice a day for 5 days. Avoid scratching the affected area. Monitor for increased redness, spreading, fever, or testicular pain and return if these symptoms occur.

## 2024-07-23 NOTE — Telephone Encounter (Signed)
 FYI Only or Action Required?: FYI only for provider.  Patient was last seen in primary care on 02/26/2024 by Jerrell Cleatus Ned, MD.  Called Nurse Triage reporting Recurrent Skin Infections.  Symptoms began several days ago.  Interventions attempted: Rest, hydration, or home remedies.  Symptoms are: gradually worsening.  Triage Disposition: See PCP When Office is Open (Within 3 Days)  Patient/caregiver understands and will follow disposition?: Yes, will follow disposition  Copied from CRM #[id number]. Topic: Clinical - Red Word Triage >> Jul 23, 2024  8:24 AM Frederich PARAS wrote: [facility] that prompted transfer to Nurse Triage: pain, swellling possible skin inffection, pain and redness, swelling in groin. its been about a couple of days, they said its been getting worst Reason for Disposition  [1] Boil AND [2] not improved > 3 days following Care Advice  Answer Assessment - Initial Assessment Questions 1. APPEARANCE of BOIL: What does the boil look like?      redness 2. LOCATION: Where is the boil located?      L groin 3. NUMBER: How many boils are there?      1 4. SIZE: How big is the boil? (e.g., inches, cm; compare to size of a coin or other object)     About size of marble 5. ONSET: When did the boil start?     Couple days 6. PAIN: Is there any pain? If Yes, ask: How bad is the pain?   (Scale 1-10; or mild, moderate, severe)     mild 7. FEVER: Do you have a fever? If Yes, ask: What is it, how was it measured, and when did it start?      denies 8. SOURCE: Have you been around anyone with boils or other Staph infections? Have you ever had boils before?     yes 9. OTHER SYMPTOMS: Do you have any other symptoms? (e.g., shaking chills, weakness, rash elsewhere on body)     denies  Hx of MRSA, hx of boil.  Protocols used: Boil (Skin Abscess)-A-AH

## 2024-07-23 NOTE — Telephone Encounter (Signed)
 Patient is seeing you today for ain, swellling possible skin inffection, pain and redness, swelling in groin. its been about a couple of days, they said its been getting worse

## 2024-07-23 NOTE — Progress Notes (Signed)
 Acute Office Visit  Subjective:     Patient ID: Scott Molina, male    DOB: 11-Jul-1981, 43 y.o.   MRN: [id number]  Chief Complaint  Patient presents with   Acute Visit    Boil the size of a marble in groin area. Noticed over a week ago. Area has grown.     HPI  Discussed the use of AI scribe software for clinical note transcription with the patient, who gave verbal consent to proceed.  History of Present Illness Scott Molina is a 43 year old male who presents with a skin lesion in the groin area. He is accompanied by his wife, Alan.  He noticed an itchy sensation in the groin area about 10 days ago, initially thinking it was a hair or pimple. The itching subsided after a couple of days, but a small, round spot remained under the skin. The lesion, initially a small round ball, later elongated and stings slightly, especially at the end of his work shift and while walking.  He has a history of a similar lesion on his hip, which was diagnosed as MRSA, causing significant anxiety about the current lesion potentially spreading. He denies recent shaving in the area but admits to occasional scratching. He is not on any medication for this issue but has previously taken doxycycline for a tick bite.  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 No fever, significant pain, or swelling in the testicle or scrotum. Reports itching in the left groin area for the past two days.      Objective:    BP 106/73 (BP Location: Right Arm, Patient Position: Sitting, Cuff Size: Normal)   Pulse 82   Ht 5' 11 (1.803 m)   Wt 175 lb 12.8 oz (79.7 kg)   SpO2 97%   BMI 24.52 kg/m   Physical Exam  Gen: Well-appearing man Skin: In the left side of the groin, in the skin fold there is a small dermal nodule, less than a centimeter in size, with a small amount of surrounding erythema.  No fluctuance, its mostly firm.  It is very small.  No vesicles.  No lymphatic  streaking.  No other lymphadenopathy in the groin. Genitals: Normal scrotum, no erythema of the scrotum, no discomfort at the testicles      Assessment & Plan:    Problem List Items Addressed This Visit       Unprioritized   Cellulitis of left groin - Primary   A small superficial skin and soft tissue infection on the left side of the groin in the skin fold seems very low risk.  It looks to be healing with a low risk of progression due to normal weight and absence of diabetes.  I do not detect any fluctuance.  Is been there about 10 days, so I think it is already fibrosing, I do not think incision and drainage would be much help based on the exam.  Prescribed doxycycline 100 mg orally twice a day for 5 days. Advised avoiding scratching. Instruct to monitor for increased redness, spreading, fever, or testicular pain and return if these occur.      Relevant Medications   doxycycline (VIBRA-TABS) 100 MG tablet    Meds ordered this encounter  Medications   doxycycline (VIBRA-TABS) 100 MG tablet    Sig: Take 1 tablet (100 mg total) by mouth 2 (two) times daily for 5 days.    Dispense:  10 tablet  Refill:  0    Return if symptoms worsen or fail to improve.  Cleatus Debby Specking, MD

## 2024-07-29 ENCOUNTER — Ambulatory Visit: Payer: Self-pay

## 2024-07-29 NOTE — Telephone Encounter (Signed)
 Appointment 07/30/2024 at 11am with patient's PCP Dr Vincent    FYI Only or Action Required?: Action required by provider: update on patient condition.  Patient was last seen in primary care on 07/23/2024 by Jerrell Cleatus Ned, MD.  Called Nurse Triage reporting Abscess.  Symptoms began couple of days before prier appt on 07/23/2024.  Interventions attempted: Prescription medications: doxycycline, Rest, hydration, or home remedies, and Other: as directed by pcp at last visit.  Symptoms are: unchanged.  Triage Disposition: See PCP When Office is Open (Within 3 Days)  Patient/caregiver understands and will follow disposition?: Yes      Message from [place] [facility] sent at 07/29/2024  8:06 AM EDT  Summary: abscess   Reason for Triage: infection under skin  not better after round of antibiotics,abscess Please call wife amanda      Reason for Disposition  [1] Treatment (antibiotic, I&D, or moist heat) > 72 hours (3 days) AND [2] symptoms are SAME (not getting better)  Answer Assessment - Initial Assessment Questions Patient at work---spoke with his significant other [employer] spot of blood Hasn't gotten worse but hasn't gotten better  She is advised to call us  back if anything changes or with any further questions/concerns. She is advised that if anything worsens to go to the Emergency Room. She verbalized understanding.   1. APPEARANCE: What does the boil (abscess) look like?      Same as previous visit 2. LOCATION: Where is the boil located?      Left groin area 3. NUMBER: How many boils are there?      1 4. SIZE: How big is the boil? (e.g., inches, cm; compare to size of a coin or other object)     Same size 5. ONSET: When did the boil start?     Couple of days before previous appt 07/23/2024 6. PAIN: Is there any pain? If Yes, ask: How bad is the pain?  (Scale 1-10; or mild, moderate, severe)     Stings bothersome throughout the day 7.  FEVER: Do you have a fever? If Yes, ask: What is it, how was it measured, and when did it start?      no 8. TREATMENT: What treatment did you get or are you getting for the boil? (e.g., I&D, antibiotics, moist heat)     Finished antibiotic doxycycline 9. OTHER SYMPTOMS: Do you have any other symptoms? (e.g., rash elsewhere on body, shaking chills, spreading redness of nearby skin or red streaks, weakness)      denies  Protocols used: Boil (Skin Abscess) on Treatment Follow-up Call-A-AH

## 2024-07-30 ENCOUNTER — Ambulatory Visit: Admitting: Student in an Organized Health Care Education/Training Program

## 2024-07-30 ENCOUNTER — Encounter: Payer: Self-pay | Admitting: Student in an Organized Health Care Education/Training Program

## 2024-07-30 VITALS — BP 112/82 | HR 81 | Ht 71.0 in | Wt 175.8 lb

## 2024-07-30 DIAGNOSIS — L03314 Cellulitis of groin: Secondary | ICD-10-CM

## 2024-07-30 NOTE — Patient Instructions (Signed)
  VISIT SUMMARY: Today, you were seen for a persistent skin lesion with drainage. You reported that the lesion has not changed in size but has shown some drainage and slight bleeding. You completed a five-day course of Tigecycline and are concerned about the healing process and potential changes in the lesion.  YOUR PLAN: -HEALING CELLULITIS AND CUTANEOUS ABSCESS OF LEFT GROIN: Cellulitis is a bacterial skin infection, and an abscess is a collection of pus that has built up within the tissue. Your cellulitis and abscess are healing well with no signs of active inflammation or redness. You should continue to cover the area with a Band-Aid and monitor for any increased drainage or signs of infection. Avoid touching or trying to drain the lesion yourself. You can continue your normal activities as you feel comfortable. Please return if you notice any significant changes.  INSTRUCTIONS: Please monitor the lesion for any increased drainage or signs of infection. Avoid touching or manipulating the area. Continue your normal activities as tolerated. Return to the clinic if you notice any significant changes in the lesion.

## 2024-07-30 NOTE — Assessment & Plan Note (Signed)
 1 cm diminutive area of erythema on the left groin in the skin fold.  There is a small erosion over the skin.  No drainage.  There is a subcutaneous nodule deep to it.  On ultrasound this looks like an area of cellulitis that is now fibrosed and is healing.  I do not see any active area of abscess that needs drainage.  There is minimal erythema on the skin.  Overall I think this is a small skin and soft tissue infection at the fold of the skin in the groin that is healing well.  I gave reassurance about the palpable nodule and the small amount of drainage that he is experiencing.  I do not think this needs further antibiotics, and I do not think incision and drainage would help because it is already so firm.  I think this has a very good chance of resolving in the coming 1-2 weeks.

## 2024-07-30 NOTE — Progress Notes (Signed)
 Acute Office Visit  Subjective:     Patient ID: Scott Molina, male    DOB: 1981-09-20, 43 y.o.   MRN: [id number]  Chief Complaint  Patient presents with   Groin Swelling    Abscess left groin area not any better after antibiotics but not worse and no new symptom. Friday and Saturday noticed change. Leaking since Saturday. Red tinge on bandage. No yellow drainage     HPI  Discussed the use of AI scribe software for clinical note transcription with the patient, who gave verbal consent to proceed.  History of Present Illness Scott Molina is a 43 year old male who presents with concerns about a persistent skin lesion with drainage.  He has a persistent skin lesion that has not significantly changed in size but has shown some drainage. The lesion bled slightly on Friday after work and continued to seep a small amount of blood over the weekend. By Monday, after showering, he noticed a drop of red on the washcloth he used to dry the area.  The drainage is described as a slightly off color of blood, without any pus or yellow discharge. He has been using a washcloth to dry the area and has attempted to keep it separate and clean.  He experiences some discomfort, describing a stinging sensation when walking or driving, although it is not significantly bothersome. No fevers have been reported, and he has been able to continue working, including operating a forklift, despite some annoyance from the lesion.  He completed a five-day course of doxycycline, with the last dose taken on Sunday morning, and tolerated the medication well. He is concerned about the lesion's healing process and whether it will leave a permanent hard spot, as well as any potential changes that might require further medical attention.      Objective:    BP 112/82 (BP Location: Right Arm, Patient Position: Sitting, Cuff Size: Normal)   Pulse 81   Ht 5' 11 (1.803 m)   Wt 175 lb 12.8 oz (79.7 kg)   SpO2 100%   BMI  24.52 kg/m   Physical Exam  Gen: Well-appearing man Skin: On the left side of his groin there is a 1 cm skin erosion at the skin folds.  Just deep to this there is a palpable subcutaneous nodule that is about 1.5 cm long and 1 cm wide.  It is nontender to palpation, no fluctuance.  No drainage.  Minimal surrounding erythema.  Scrotum appears healthy.  POCUS: Ultrasound completed to evaluate for abscess.  The small firm nodule is an area of heterogenous cobblestoning, very superficial, less than 1 cm in depth.  The area is noncompressible.  No clear abscess.      Assessment & Plan:   Problem List Items Addressed This Visit       Unprioritized   Cellulitis of left groin - Primary   1 cm diminutive area of erythema on the left groin in the skin fold.  There is a small erosion over the skin.  No drainage.  There is a subcutaneous nodule deep to it.  On ultrasound this looks like an area of cellulitis that is now fibrosed and is healing.  I do not see any active area of abscess that needs drainage.  There is minimal erythema on the skin.  Overall I think this is a small skin and soft tissue infection at the fold of the skin in the groin that is healing well.  I gave reassurance  about the palpable nodule and the small amount of drainage that he is experiencing.  I do not think this needs further antibiotics, and I do not think incision and drainage would help because it is already so firm.  I think this has a very good chance of resolving in the coming 1-2 weeks.       Return if symptoms worsen or fail to improve.  Cleatus Debby Specking, MD

## 2024-10-02 ENCOUNTER — Other Ambulatory Visit: Payer: Self-pay | Admitting: Student in an Organized Health Care Education/Training Program

## 2024-10-02 DIAGNOSIS — R12 Heartburn: Secondary | ICD-10-CM

## 2024-11-27 ENCOUNTER — Encounter: Admitting: Student in an Organized Health Care Education/Training Program
